# Patient Record
Sex: Female | Born: 1950 | Race: White | Hispanic: No | Smoking: Former smoker
Health system: Southern US, Community
[De-identification: ages and names within clinical notes are randomized; demographics above are authoritative.]

## PROBLEM LIST (undated history)

## (undated) DIAGNOSIS — D0471 Carcinoma in situ of skin of right lower limb, including hip: Secondary | ICD-10-CM

## (undated) DIAGNOSIS — Q211 Atrial septal defect: Secondary | ICD-10-CM

## (undated) DIAGNOSIS — D0472 Carcinoma in situ of skin of left lower limb, including hip: Secondary | ICD-10-CM

## (undated) DIAGNOSIS — D045 Carcinoma in situ of skin of trunk: Secondary | ICD-10-CM

## (undated) DIAGNOSIS — M1990 Unspecified osteoarthritis, unspecified site: Secondary | ICD-10-CM

## (undated) DIAGNOSIS — T7840XA Allergy, unspecified, initial encounter: Secondary | ICD-10-CM

## (undated) DIAGNOSIS — F419 Anxiety disorder, unspecified: Secondary | ICD-10-CM

## (undated) DIAGNOSIS — Q2112 Patent foramen ovale: Secondary | ICD-10-CM

## (undated) DIAGNOSIS — K219 Gastro-esophageal reflux disease without esophagitis: Secondary | ICD-10-CM

## (undated) HISTORY — DX: Anxiety disorder, unspecified: F41.9

## (undated) HISTORY — DX: Carcinoma in situ of skin of trunk: D04.5

## (undated) HISTORY — DX: Allergy, unspecified, initial encounter: T78.40XA

## (undated) HISTORY — DX: Carcinoma in situ of skin of right lower limb, including hip: D04.71

## (undated) HISTORY — PX: ABDOMINAL HYSTERECTOMY: SHX81

## (undated) HISTORY — PX: JOINT REPLACEMENT: SHX530

## (undated) HISTORY — PX: COSMETIC SURGERY: SHX468

## (undated) HISTORY — PX: EYE SURGERY: SHX253

## (undated) HISTORY — PX: BACK SURGERY: SHX140

## (undated) HISTORY — PX: SPINE SURGERY: SHX786

## (undated) HISTORY — DX: Carcinoma in situ of skin of left lower limb, including hip: D04.72

## (undated) HISTORY — PX: CERVICAL SPINE SURGERY: SHX589

## (undated) HISTORY — PX: VAGINAL HYSTERECTOMY: SUR661

## (undated) HISTORY — PX: BREAST SURGERY: SHX581

---

## 1997-04-20 ENCOUNTER — Other Ambulatory Visit: Admission: RE | Admit: 1997-04-20 | Discharge: 1997-04-20 | Payer: Self-pay | Admitting: Gynecology

## 1998-12-21 ENCOUNTER — Other Ambulatory Visit: Admission: RE | Admit: 1998-12-21 | Discharge: 1998-12-21 | Payer: Self-pay | Admitting: Gynecology

## 2000-10-26 ENCOUNTER — Encounter: Payer: Self-pay | Admitting: Neurosurgery

## 2000-10-26 ENCOUNTER — Ambulatory Visit (HOSPITAL_COMMUNITY): Admission: RE | Admit: 2000-10-26 | Discharge: 2000-10-26 | Payer: Self-pay | Admitting: Neurosurgery

## 2003-05-22 ENCOUNTER — Other Ambulatory Visit: Admission: RE | Admit: 2003-05-22 | Discharge: 2003-05-22 | Payer: Self-pay | Admitting: Family Medicine

## 2006-11-29 ENCOUNTER — Ambulatory Visit (HOSPITAL_COMMUNITY): Admission: RE | Admit: 2006-11-29 | Discharge: 2006-11-29 | Payer: Self-pay | Admitting: Neurosurgery

## 2008-04-02 ENCOUNTER — Encounter: Admission: RE | Admit: 2008-04-02 | Discharge: 2008-04-02 | Payer: Self-pay | Admitting: Neurosurgery

## 2008-09-16 ENCOUNTER — Ambulatory Visit (HOSPITAL_COMMUNITY): Admission: RE | Admit: 2008-09-16 | Discharge: 2008-09-16 | Payer: Self-pay | Admitting: Neurosurgery

## 2008-10-12 ENCOUNTER — Encounter: Admission: RE | Admit: 2008-10-12 | Discharge: 2008-10-12 | Payer: Self-pay | Admitting: Family Medicine

## 2008-10-16 ENCOUNTER — Encounter: Admission: RE | Admit: 2008-10-16 | Discharge: 2008-10-16 | Payer: Self-pay | Admitting: Family Medicine

## 2009-07-27 ENCOUNTER — Encounter: Admission: RE | Admit: 2009-07-27 | Discharge: 2009-07-27 | Payer: Self-pay | Admitting: Family Medicine

## 2009-10-22 ENCOUNTER — Emergency Department (HOSPITAL_COMMUNITY): Admission: EM | Admit: 2009-10-22 | Discharge: 2009-10-22 | Payer: Self-pay | Admitting: Emergency Medicine

## 2009-10-22 ENCOUNTER — Encounter: Payer: Self-pay | Admitting: Internal Medicine

## 2009-11-16 ENCOUNTER — Ambulatory Visit: Payer: Self-pay | Admitting: Internal Medicine

## 2009-11-16 DIAGNOSIS — R0602 Shortness of breath: Secondary | ICD-10-CM | POA: Insufficient documentation

## 2009-11-16 DIAGNOSIS — R05 Cough: Secondary | ICD-10-CM | POA: Insufficient documentation

## 2009-11-16 DIAGNOSIS — R059 Cough, unspecified: Secondary | ICD-10-CM | POA: Insufficient documentation

## 2009-11-29 ENCOUNTER — Ambulatory Visit: Payer: Self-pay | Admitting: Internal Medicine

## 2009-12-27 ENCOUNTER — Ambulatory Visit: Payer: Self-pay | Admitting: Internal Medicine

## 2009-12-27 ENCOUNTER — Encounter: Payer: Self-pay | Admitting: Internal Medicine

## 2010-02-03 ENCOUNTER — Encounter
Admission: RE | Admit: 2010-02-03 | Discharge: 2010-02-03 | Payer: Self-pay | Source: Home / Self Care | Attending: Family Medicine | Admitting: Family Medicine

## 2010-02-15 NOTE — Assessment & Plan Note (Signed)
Summary: Pulmonary Initial eval for cough/ sob   Visit Type:  Initial Consult Copy to:  Self Primary Provider/Referring Provider:  Dr. Nedra Hai  CC:  Dyspnea.  History of Present Illness: 15 yowf quit smoking 2003 with tendency to bad coughing since around 1995 some better since quit smoking with no seasonal pattern and no need for chronic rx.  November 16, 2009  1st pulmonary office eval cc sev years doe with ex assoc with persistent cough productive of minimal  white mucus rx with saba since Oct 22 2009 > much better.  Used spiriva x one box no benefit.  sob with > adl's even when not coughing.  cough day > night and no early am exac pattern. assoc with gen (not typically pleuritic) CP and hb with mild dysphagia.  Pt denies any significant sore throat,  itching, sneezing,  nasal congestion or excess secretions,  fever, chills, sweats, unintended wt loss, pleuritic or exertional cp, hempoptysis, orthopnea pnd or leg swelling Pt also denies any obvious fluctuation in symptoms with weather or environmental change or other alleviating or aggravating factors.         Current Medications (verified): 1)  Cyclobenzaprine Hcl 10 Mg Tabs (Cyclobenzaprine Hcl) .Marland Kitchen.. 1 Three Times A Day As Needed 2)  Nabumetone 500 Mg Tabs (Nabumetone) .... 2 Two Times A Day 3)  Citalopram Hydrobromide 20 Mg Tabs (Citalopram Hydrobromide) .Marland Kitchen.. 1 Once Daily 4)  Tramadol Hcl 50 Mg Tabs (Tramadol Hcl) .Marland Kitchen.. 1 To 2 Every 6 Hrs As Needed For Pain 5)  Alprazolam 0.5 Mg Tabs (Alprazolam) .... 1/2 To 1 Two Times A Day As Needed 6)  Aspirin 81 Mg Tbec (Aspirin) .Marland Kitchen.. 1 Once Daily 7)  Vitamin D 1000 Unit Tabs (Cholecalciferol) .Marland Kitchen.. 1 Once Daily 8)  Albuterol Sulfate (5 Mg/ml) 0.5% Nebu (Albuterol Sulfate) .Marland Kitchen.. 1 Vial in Nebulizer Up To Four Times A Day As Needed  Allergies (verified): 1)  Codeine  Past History:  Past Medical History: Cough/ sob x 2009  Past Surgical History: Back surgery 1998 Neck surgery  1992 Hysterectomy 1987 Left knee surgery x 2 Bilateral carpul tunnel release  Family History: Negative for respiratory diseases or atopy   Social History: Divorced Insurance account manager work  Former smoker.  Quit in 2003.  Smoked approx 10 yrs up to 1/4 ppd No ETOH  Review of Systems       The patient complains of shortness of breath with activity, shortness of breath at rest, productive cough, chest pain, acid heartburn, indigestion, difficulty swallowing, headaches, and joint stiffness or pain.  The patient denies non-productive cough, coughing up blood, irregular heartbeats, loss of appetite, weight change, abdominal pain, sore throat, tooth/dental problems, nasal congestion/difficulty breathing through nose, sneezing, itching, ear ache, anxiety, depression, hand/feet swelling, rash, change in color of mucus, and fever.    Vital Signs:  Patient profile:   60 year old female Height:      63 inches Weight:      183.13 pounds BMI:     32.56 O2 Sat:      96 % on Room air Temp:     97.8 degrees F oral Pulse rate:   70 / minute BP sitting:   132 / 88  (left arm)  Vitals Entered By: Vernie Murders (November 16, 2009 10:57 AM)  O2 Flow:  Room air  Physical Exam  Additional Exam:  wt 183 November 17, 2009 HEENT: nl dentition, turbinates, and orophanx. Nl external ear canals without cough  reflex NECK :  without JVD/Nodes/TM/ nl carotid upstrokes bilaterally LUNGS: no acc muscle use, clear to A and P bilaterally without cough on insp or exp maneuvers CV:  RRR  no s3 or murmur or increase in P2, no edema  ABD:  soft and nontender with nl excursion in the supine position. No bruits or organomegaly, bowel sounds nl MS:  warm without deformities, calf tenderness, cyanosis or clubbing SKIN: warm and dry without lesions   NEURO:  alert, approp, no deficits     CT of Chest  Procedure date:  10/22/2009  Findings:      Neg CT angiogram  Impression & Recommendations:  Problem # 1:   COUGH (ICD-786.2)  The most common causes of chronic cough in immunocompetent adults include: upper airway cough syndrome (UACS), previously referred to as postnasal drip syndrome,  caused by variety of rhinosinus conditions; (2) asthma; (3) GERD; (4) chronic bronchitis from cigarette smoking or other inhaled environmental irritants; (5) nonasthmatic eosinophilic bronchitis; and (6) bronchiectasis. These conditions, singly or in combination, have accounted for up to 94% of the causes of chronic cough in prospective studies.   this is most c/w  Classic Upper airway cough syndrome, so named because it's frequently impossible to sort out how much is  CR/sinusitis with freq throat clearing (which can be related to primary GERD)   vs  causing  secondary extra esophageal GERD from wide swings in gastric pressure that occur with throat clearing, promoting self use of mint and menthol lozenges that reduce the lower esophageal sphincter tone and exacerbate the problem further These are the same pts who not infrequently have failed to tolerate ace inhibitors,  dry powder inhalers or biphosphonates or report having reflux symptoms that don't respond to standard doses of PPI  See instructions for specific recommendations   Orders: Consultation Level V (16109)  Problem # 2:  DYSPNEA (ICD-786.05)  May have asthma based on reported improvement on saba  I spent extra time with the patient today explaining optimal mdi  technique.  This improved from  50-75%  See instructions for specific recommendations   Orders: Consultation Level V [phone])  Medications Added to Medication List This Visit: 1)  Cyclobenzaprine Hcl 10 Mg Tabs (Cyclobenzaprine hcl) .Marland Kitchen.. 1 three times a day as needed 2)  Nabumetone 500 Mg Tabs (Nabumetone) .... 2 two times a day 3)  Citalopram Hydrobromide 20 Mg Tabs (Citalopram hydrobromide) .Marland Kitchen.. 1 once daily 4)  Tramadol Hcl 50 Mg Tabs (Tramadol hcl) .Marland Kitchen.. 1 to 2 every 6 hrs as needed for  pain 5)  Tramadol Hcl 50 Mg Tabs (Tramadol hcl) .Marland Kitchen.. 1 to 2 every 6 hrs as needed for pain or cough 6)  Alprazolam 0.5 Mg Tabs (Alprazolam) .... 1/2 to 1 two times a day as needed 7)  Aspirin 81 Mg Tbec (Aspirin) .Marland Kitchen.. 1 once daily 8)  Vitamin D 1000 Unit Tabs (Cholecalciferol) .Marland Kitchen.. 1 once daily 9)  Albuterol Sulfate (5 Mg/ml) 0.5% Nebu (Albuterol sulfate) .Marland Kitchen.. 1 vial in nebulizer up to four times a day as needed 10)  Ventolin Hfa 108 (90 Base) Mcg/act Aers (Albuterol sulfate) .Marland Kitchen.. 1-2 puffs every 4-6 hours as needed 11)  Dulera 100-5 Mcg/act Aero (Mometasone furo-formoterol fum) .... 2 puffs first thing  in am and 2 puffs again in pm about 12 hours later 12)  Prilosec Otc 20 Mg Tbec (Omeprazole magnesium) .... Take  one 30-60 min before first meal of the day 13)  Pepcid 20 Mg Tabs (Famotidine) .Marland KitchenMarland KitchenMarland Kitchen  Take one by mouth at bedtime  Patient Instructions: 1)  Take delsym two tsp every 12 hours and add tramadol 50 mg up to every 4 hours to suppress the urge to cough. Swallowing water or using ice chips/non mint and menthol containing candies (such as lifesavers or sugarless jolly ranchers) are also effective.  2)  Dulera 100 2 puffs first thing  in am and 2 puffs again in pm about 12 hours later  3)  Prilosec Take  one 30-60 min before first meal of the day and pepcid at bedtime 4)  Please schedule a follow-up appointment in 2 weeks, sooner if needed  5)  GERD (REFLUX)  is a common cause of respiratory symptoms. It commonly presents without heartburn and can be treated with medication, but also with lifestyle changes including avoidance of late meals, excessive alcohol, smoking cessation, and avoid fatty foods, chocolate, peppermint, colas, red wine, and acidic juices such as orange juice. NO MINT OR MENTHOL PRODUCTS SO NO COUGH DROPS  6)  USE SUGARLESS CANDY INSTEAD (jolley ranchers)  7)  NO OIL BASED VITAMINS  Prescriptions: TRAMADOL HCL 50 MG TABS (TRAMADOL HCL) 1 to 2 every 6 hrs as needed for pain  or cough  #40 x 0   Entered and Authorized by:   Nyoka Cowden MD   Signed by:   Nyoka Cowden MD on 11/16/2009   Method used:   Electronically to        [facility]  [address] [phone]* (retail)       [address]       [place], [place]  [address]       Ph: [phone]       Fax: [phone]   RxID:   [id number]

## 2010-02-15 NOTE — Assessment & Plan Note (Signed)
Summary: Pulmonary/ ext ov with hfa 90% and better on dulera 100   Copy to:  Self Primary Provider/Referring Provider:  Dr. Nedra Hai  CC:  Dyspnea and Cough- much improved.  History of Present Illness: 60 yowf quit smoking 2003 with tendency to bad coughing since around 1995 some better since quit smoking with no seasonal pattern and no need for chronic rx.  November 16, 2009  1st pulmonary office eval cc sev years doe with ex assoc with persistent cough productive of minimal  white mucus rx with saba since Oct 22 2009 > much better.  Used spiriva x one box no benefit.  sob with > adl's even when not coughing.  cough day > night and no early am exac pattern. assoc with gen (not typically pleuritic) CP and hb with mild dysphagia.   rec Take delsym two tsp every 12 hours and add tramadol 50 mg up to every 4 hours to suppress the urge to cough. Swallowing water or using ice chips/non mint and menthol containing candies (such as lifesavers or sugarless jolly ranchers) are also effective.  Dulera 100 2 puffs first thing  in am and 2 puffs again in pm about 12 hours later  Prilosec Take  one 30-60 min before first meal of the day and pepcid at bedtime  November 29, 2009  cc Dyspnea and Cough- much improved. Pt denies any significant sore throat, dysphagia, itching, sneezing,  nasal congestion or excess secretions,  fever, chills, sweats, unintended wt loss, pleuritic or exertional cp, hempoptysis, change in activity tolerance  orthopnea pnd or leg swelling Pt also denies any obvious fluctuation in symptoms with weather or environmental change or other alleviating or aggravating factors.        Current Medications (verified): 1)  Cyclobenzaprine Hcl 10 Mg Tabs (Cyclobenzaprine Hcl) .Marland Kitchen.. 1 Three Times A Day As Needed 2)  Nabumetone 500 Mg Tabs (Nabumetone) .... 2 Two Times A Day 3)  Citalopram Hydrobromide 20 Mg Tabs (Citalopram Hydrobromide) .Marland Kitchen.. 1 Once Daily 4)  Tramadol Hcl 50 Mg Tabs (Tramadol  Hcl) .Marland Kitchen.. 1 To 2 Every 6 Hrs As Needed For Pain or Cough 5)  Alprazolam 0.5 Mg Tabs (Alprazolam) .... 1/2 To 1 Two Times A Day As Needed 6)  Aspirin 81 Mg Tbec (Aspirin) .Marland Kitchen.. 1 Once Daily 7)  Vitamin D 1000 Unit Tabs (Cholecalciferol) .Marland Kitchen.. 1 Once Daily 8)  Ventolin Hfa 108 (90 Base) Mcg/act  Aers (Albuterol Sulfate) .Marland Kitchen.. 1-2 Puffs Every 4-6 Hours As Needed 9)  Dulera 100-5 Mcg/act Aero (Mometasone Furo-Formoterol Fum) .... 2 Puffs First Thing  in Am and 2 Puffs Again in Pm About 12 Hours Later 10)  Prilosec Otc 20 Mg Tbec (Omeprazole Magnesium) .... Take  One 30-60 Min Before First Meal of The Day 11)  Pepcid 20 Mg Tabs (Famotidine) .... Take One By Mouth At Bedtime  Allergies (verified): 1)  Codeine  Past History:  Past Medical History: Cough/ sob x 2009      -  HFA 90% p coaching November 29, 2009    Vital Signs:  Patient profile:   60 year old female Weight:      181 pounds O2 Sat:      95 % on Room air Temp:     98.0 degrees F oral Pulse rate:   74 / minute BP sitting:   118 / 76  (left arm)  Vitals Entered By: Vernie Murders (November 29, 2009 11:09 AM)  O2 Flow:  Room air  Physical Exam  Additional Exam:  wt 183 November 17, 2009 > 181 November 29, 2009  HEENT: nl dentition, turbinates, and orophanx. Nl external ear canals without cough reflex NECK :  without JVD/Nodes/TM/ nl carotid upstrokes bilaterally LUNGS: no acc muscle use, clear to A and P bilaterally without cough on insp or exp maneuvers CV:  RRR  no s3 or murmur or increase in P2, no edema  ABD:  soft and nontender with nl excursion in the supine position. No bruits or organomegaly, bowel sounds nl MS:  warm without deformities, calf tenderness, cyanosis or clubbing       Impression & Recommendations:  Problem # 1:  COUGH (ICD-786.2)  The most common causes of chronic cough in immunocompetent adults include: upper airway cough syndrome (UACS), previously referred to as postnasal drip syndrome,  caused  by variety of rhinosinus conditions; (2) asthma; (3) GERD; (4) chronic bronchitis from cigarette smoking or other inhaled environmental irritants; (5) nonasthmatic eosinophilic bronchitis; and (6) bronchiectasis. These conditions, singly or in combination, have accounted for up to 94% of the causes of chronic cough in prospective studies.  Much better with rx with dulera and ppi/h2hs so issue is whether this is cough assoc with gerd or gerd causing cough.  will try taper gerd rx then see if symptoms or need for saba increase. See instructions for specific recommendations   .  This improved from  50-90% with coaching  Each maintenance medication was reviewed in detail including most importantly the difference between maintenance prns and under what circumstances the prns are to be used.  In addition, these two groups (for which the patient should keep up with refills) were distinguished from a third group :  meds that are used only short term with the intent to complete a course of therapy and then not refill them.  The med list was then fully reconciled and reorganized to reflect this important distinction.   Other Orders: Est. Patient Level IV (04540)  Patient Instructions: 1)   Think of your medications in 3 separate categories and keep them separate:  2)  a   The ones you take no matter what daily on a scheduled basis (dulera) 3)  b   The ones you only take if needed for specific problems(ventolin)  4)  c   The ones you take for a short course and stop, like antibiotics and prednisone. 5)   Stop pepcid for now, restart if you  6)  Please schedule a follow-up appointment in 4  weeks, sooner if needed with pft's on return

## 2010-02-17 NOTE — Miscellaneous (Signed)
Summary: Orders Update pft charges  Clinical Lists Changes  Orders: Added new Service order of Carbon Monoxide diffusing w/capacity (94720) - Signed Added new Service order of Lung Volumes (94240) - Signed Added new Service order of Spirometry (Pre & Post) (94060) - Signed 

## 2010-02-17 NOTE — Assessment & Plan Note (Signed)
Summary: Pulmonary/ f/u ov PFT's nl    Copy to:  Self Primary Provider/Referring Provider:  Dr. Nedra Hai  CC:  Cough and dyspnea- the same.  History of Present Illness: 60  yowf quit smoking 2003 with tendency to bad coughing since around 1995 some better since quit smoking with no seasonal pattern and no need for chronic rx.  November 16, 2009  1st pulmonary office eval cc sev years doe with ex assoc with persistent cough productive of minimal  white mucus rx with saba since Oct 22 2009 > much better.  Used spiriva x one box no benefit.  sob with > adl's even when not coughing.  cough day > night and no early am exac pattern. assoc with gen (not typically pleuritic) CP and hb with mild dysphagia.   rec Take delsym two tsp every 12 hours and add tramadol 50 mg up to every 4 hours to suppress the urge to cough. Swallowing water or using ice chips/non mint and menthol containing candies (such as lifesavers or sugarless jolly ranchers) are also effective.  Dulera 100 2 puffs first thing  in am and 2 puffs again in pm about 12 hours later  Prilosec Take  one 30-60 min before first meal of the day and pepcid at bedtime  November 29, 2009  cc Dyspnea and Cough- much improved. Pt denies any significant sore throat, dysphagia, itching, sneezing,  nasal congestion or excess secretions,  fever, chills, sweats, unintended wt loss, pleuritic or exertional cp, hempoptysis, change in activity tolerance  orthopnea pnd or leg swelling Pt also denies any obvious fluctuation in symptoms with weather or environmental change or other alleviating or aggravating factors.       Current Medications (verified): 1)  Prilosec Otc 20 Mg Tbec (Omeprazole Magnesium) .... Take  One 30-60 Min Before First Meal of The Day 2)  Dulera 100-5 Mcg/act Aero (Mometasone Furo-Formoterol Fum) .... 2 Puffs First Thing  in Am and 2 Puffs Again in Pm About 12 Hours Later 3)  Aspirin 81 Mg Tbec (Aspirin) .Marland Kitchen.. 1 Once Daily 4)  Nabumetone  500 Mg Tabs (Nabumetone) .... 2 Two Times A Day 5)  Vitamin D 1000 Unit Tabs (Cholecalciferol) .Marland Kitchen.. 1 Once Daily 6)  Citalopram Hydrobromide 20 Mg Tabs (Citalopram Hydrobromide) .Marland Kitchen.. 1 Once Daily 7)  Tramadol Hcl 50 Mg Tabs (Tramadol Hcl) .Marland Kitchen.. 1 To 2 Every 6 Hrs As Needed For Pain or Cough 8)  Alprazolam 0.5 Mg Tabs (Alprazolam) .... 1/2 To 1 Two Times A Day As Needed 9)  Cyclobenzaprine Hcl 10 Mg Tabs (Cyclobenzaprine Hcl) .Marland Kitchen.. 1 Three Times A Day As Needed 10)  Ventolin Hfa 108 (90 Base) Mcg/act  Aers (Albuterol Sulfate) .Marland Kitchen.. 1-2 Puffs Every 4-6 Hours As Needed  Allergies (verified): 1)  Codeine  Past History:  Past Medical History: Cough/ sob x 2009      - CT chest neg 10/22/09      -  HFA 90% p coaching November 29, 2009        - PFT's 12/27/09 FEV1 2.10 (90%) ratio 80 and DLC0 66%  Vital Signs:  Patient profile:   60 year old female Height:      64 inches Weight:      184 pounds BMI:     31.70 O2 Sat:      98 % on Room air Temp:     97.6 degrees F oral Pulse rate:   70 / minute BP sitting:   106 / 60  (  left arm)  Vitals Entered By: Vernie Murders (December 27, 2009 12:01 PM)  O2 Flow:  Room air  Physical Exam  Additional Exam:  wt 183 November 17, 2009 > 181 November 29, 2009 > 184 December 28, 2009  HEENT: nl dentition, turbinates, and orophanx. Nl external ear canals without cough reflex NECK :  without JVD/Nodes/TM/ nl carotid upstrokes bilaterally LUNGS: no acc muscle use, clear to A and P bilaterally without cough on insp or exp maneuvers CV:  RRR  no s3 or murmur or increase in P2, no edema  ABD:  soft and nontender with nl excursion in the supine position. No bruits or organomegaly, bowel sounds nl MS:  warm without deformities, calf tenderness, cyanosis or clubbing       Impression & Recommendations:  Problem # 1:  DYSPNEA (ICD-786.05)  No evidence of airflow obstruction so this is not copd but may be asthma.  try taper dulera and see if flares using the  reverse of a therapeutic trial   Problem # 2:  COUGH (ICD-786.2)  The most common causes of chronic cough in immunocompetent adults include: upper airway cough syndrome (UACS), previously referred to as postnasal drip syndrome,  caused by variety of rhinosinus conditions; (2) asthma; (3) GERD; (4) chronic bronchitis from cigarette smoking or other inhaled environmental irritants; (5) nonasthmatic eosinophilic bronchitis; and (6) bronchiectasis. These conditions, singly or in combination, have accounted for up to 94% of the causes of chronic cough in prospective studies.  Much better with rx with dulera and ppi/h2hs so issue is whether this is cough assoc with gerd or gerd causing cough. since has no more cough or significant sob will try taper dulera before stopping ppi to determine longterm rx  Orders: Est. Patient Level IV (81191)  Patient Instructions: 1)  Try off dulera and see breathing stays the same or you need the ventolin more and if so you need to return 2)  You lung function is excellent, you do have  not signifcant copd.  3)

## 2010-03-31 LAB — CBC
HCT: 38.9 % (ref 36.0–46.0)
Hemoglobin: 13 g/dL (ref 12.0–15.0)
MCH: 28.3 pg (ref 26.0–34.0)
MCHC: 33.4 g/dL (ref 30.0–36.0)
MCV: 84.6 fL (ref 78.0–100.0)
Platelets: 234 10*3/uL (ref 150–400)
RBC: 4.6 MIL/uL (ref 3.87–5.11)
RDW: 12.1 % (ref 11.5–15.5)
WBC: 7.7 10*3/uL (ref 4.0–10.5)

## 2010-03-31 LAB — BASIC METABOLIC PANEL
BUN: 15 mg/dL (ref 6–23)
CO2: 21 mEq/L (ref 19–32)
Calcium: 9.3 mg/dL (ref 8.4–10.5)
Chloride: 107 mEq/L (ref 96–112)
Creatinine, Ser: 0.76 mg/dL (ref 0.4–1.2)
GFR calc Af Amer: >60 mL/min (ref >60)
GFR calc non Af Amer: >60 mL/min (ref >60)
Glucose, Bld: 97 mg/dL (ref 70–99)
Potassium: 4 mEq/L (ref 3.5–5.1)
Sodium: 138 mEq/L (ref 135–145)

## 2010-03-31 LAB — URINALYSIS, ROUTINE W REFLEX MICROSCOPIC
Bilirubin Urine: NEGATIVE
Glucose, UA: NEGATIVE mg/dL
Hgb urine dipstick: NEGATIVE
Ketones, ur: NEGATIVE mg/dL
Nitrite: NEGATIVE
Protein, ur: 30 mg/dL — AB
Specific Gravity, Urine: 1.026 (ref 1.005–1.030)
Urobilinogen, UA: 1 mg/dL (ref 0.0–1.0)
pH: 8.5 — ABNORMAL HIGH (ref 5.0–8.0)

## 2010-03-31 LAB — DIFFERENTIAL
Basophils Absolute: 0.1 10*3/uL (ref 0.0–0.1)
Basophils Relative: 1 % (ref 0–1)
Eosinophils Absolute: 0.2 10*3/uL (ref 0.0–0.7)
Eosinophils Relative: 3 % (ref 0–5)
Lymphocytes Relative: 28 % (ref 12–46)
Lymphs Abs: 2.2 10*3/uL (ref 0.7–4.0)
Monocytes Absolute: 0.5 10*3/uL (ref 0.1–1.0)
Monocytes Relative: 7 % (ref 3–12)
Neutro Abs: 4.7 10*3/uL (ref 1.7–7.7)
Neutrophils Relative %: 61 % (ref 43–77)

## 2010-03-31 LAB — URINE MICROSCOPIC-ADD ON

## 2010-03-31 LAB — POCT CARDIAC MARKERS
CKMB, poc: <1 ng/mL — ABNORMAL LOW (ref 1.0–8.0)
Myoglobin, poc: 32.2 ng/mL (ref 12–200)
Troponin i, poc: <0.05 ng/mL (ref 0.00–0.09)

## 2010-03-31 LAB — D-DIMER, QUANTITATIVE: D-Dimer, Quant: 0.27 ug/mL-FEU (ref 0.00–0.48)

## 2010-05-31 NOTE — Op Note (Signed)
[name], [name]              ACCOUNT NO.:  [id number]   MEDICAL RECORD NO.:  [id number]          PATIENT TYPE:  AMB   LOCATION:  SDS                          FACILITY:  [facility]   PHYSICIAN:  Hewitt Shorts, M.D.DATE OF BIRTH:  [birth year]   DATE OF PROCEDURE:  11/29/2006  DATE OF DISCHARGE:                               OPERATIVE REPORT   PREOPERATIVE DIAGNOSIS:  Carpal tunnel syndrome.   POSTOPERATIVE DIAGNOSIS:  Carpal tunnel syndrome.   PROCEDURE:  Right carpal tunnel release.   ANESTHESIA:  Bier block with intravenous sedation.   INDICATIONS:  The patient is a 60 year old woman with carpal tunnel  symptoms.  EMG nerve conduction confirmed carpal tunnel syndrome.  The  decision was made to proceed with the right carpal tunnel release.   PROCEDURE IN DETAIL:  The patient was brought to the operating room.  A  Bier block was administered to the right upper extremity.  The right  upper extremity was prepped with Betadine solution and draped in a  sterile fashion.  We then made an incision just medial to the right  thenar crease beginning from the distal carpal crease and extending  distally into the palm.  Dissection was carried down through the  subcutaneous tissue and went down to the transverse carpal ligament that  was carefully divided from its distal to proximal extent, carefully  avoiding any compression of the underlying median nerve.  The ligament  was fully opened from distal to proximal.  The area of compression was  noted near the midpoint of the transverse carpal ligament.  Once the  ligament was fully opened, we then proceeded with closure.  The  subcuticular layer was closed with interrupted inverted 2-0 undyed  Vicryl sutures.  The skin edges were approximated with interrupted 3-0  nylon in a horizontal mattress fashion.  Adaptic and sterile gauze was  applied, and the hand was rewrapped with a Kling.  The tourniquet was  then released.  The procedure was  tolerated well.  The estimated blood  loss was nil.  Sponge and needle counts were correct.  Following the  procedure, the patient was transferred to the recovery room with the  right upper extremity elevated with a sling.  The patient is being  discharged home.  She has Tylenol, Advil, and hydrocodone at home to use  as needed for discomfort, and she is to follow up tomorrow for a  dressing change in the office.      Hewitt Shorts, M.D.  Electronically Signed     RWN/MEDQ  D:  11/29/2006  T:  11/29/2006  Job:  [id number]

## 2010-09-02 ENCOUNTER — Other Ambulatory Visit (HOSPITAL_COMMUNITY): Payer: Self-pay | Admitting: Otolaryngology

## 2010-09-07 ENCOUNTER — Ambulatory Visit (HOSPITAL_COMMUNITY)
Admission: RE | Admit: 2010-09-07 | Discharge: 2010-09-07 | Disposition: A | Discharge status: Home / Self Care | Payer: 59 | Source: Ambulatory Visit | Attending: Otolaryngology | Admitting: Otolaryngology

## 2010-09-07 ENCOUNTER — Other Ambulatory Visit (HOSPITAL_COMMUNITY): Payer: Self-pay | Admitting: Occupational Therapy

## 2010-09-07 ENCOUNTER — Other Ambulatory Visit (HOSPITAL_COMMUNITY): Payer: Self-pay | Admitting: Radiation Oncology

## 2010-09-07 DIAGNOSIS — R1310 Dysphagia, unspecified: Secondary | ICD-10-CM | POA: Insufficient documentation

## 2010-09-29 ENCOUNTER — Other Ambulatory Visit: Payer: Self-pay | Admitting: Neurology

## 2010-09-29 DIAGNOSIS — G8929 Other chronic pain: Secondary | ICD-10-CM

## 2010-09-29 DIAGNOSIS — M545 Low back pain, unspecified: Secondary | ICD-10-CM

## 2010-10-21 ENCOUNTER — Ambulatory Visit: Payer: 59 | Attending: Otolaryngology

## 2010-10-21 ENCOUNTER — Other Ambulatory Visit: Payer: 59

## 2010-10-21 DIAGNOSIS — R490 Dysphonia: Secondary | ICD-10-CM | POA: Insufficient documentation

## 2010-10-21 DIAGNOSIS — R1313 Dysphagia, pharyngeal phase: Secondary | ICD-10-CM | POA: Insufficient documentation

## 2010-10-21 DIAGNOSIS — IMO0001 Reserved for inherently not codable concepts without codable children: Secondary | ICD-10-CM | POA: Insufficient documentation

## 2010-10-25 LAB — CBC
HCT: 38.7
Hemoglobin: 13.4
MCHC: 34.6
MCV: 84.9
Platelets: 273
RBC: 4.55
RDW: 12.5
WBC: 6.9

## 2010-10-28 ENCOUNTER — Ambulatory Visit: Payer: 59

## 2012-02-20 ENCOUNTER — Other Ambulatory Visit: Payer: Self-pay | Admitting: Family Medicine

## 2012-02-20 DIAGNOSIS — Z1231 Encounter for screening mammogram for malignant neoplasm of breast: Secondary | ICD-10-CM

## 2012-10-03 ENCOUNTER — Other Ambulatory Visit: Payer: Self-pay | Admitting: Family Medicine

## 2012-10-03 ENCOUNTER — Ambulatory Visit
Admission: RE | Admit: 2012-10-03 | Discharge: 2012-10-03 | Disposition: A | Discharge status: Home / Self Care | Payer: BC Managed Care – PPO | Source: Ambulatory Visit | Attending: Family Medicine | Admitting: Family Medicine

## 2012-10-03 DIAGNOSIS — M545 Low back pain, unspecified: Secondary | ICD-10-CM

## 2012-10-03 DIAGNOSIS — M5410 Radiculopathy, site unspecified: Secondary | ICD-10-CM

## 2013-10-02 ENCOUNTER — Other Ambulatory Visit: Payer: Self-pay

## 2013-10-02 DIAGNOSIS — Z1231 Encounter for screening mammogram for malignant neoplasm of breast: Secondary | ICD-10-CM

## 2013-10-28 ENCOUNTER — Ambulatory Visit
Admission: RE | Admit: 2013-10-28 | Discharge: 2013-10-28 | Disposition: A | Discharge status: Home / Self Care | Payer: BC Managed Care – PPO | Source: Ambulatory Visit

## 2013-10-28 DIAGNOSIS — Z1231 Encounter for screening mammogram for malignant neoplasm of breast: Secondary | ICD-10-CM

## 2014-09-22 ENCOUNTER — Other Ambulatory Visit: Payer: Self-pay

## 2014-09-22 DIAGNOSIS — Z1231 Encounter for screening mammogram for malignant neoplasm of breast: Secondary | ICD-10-CM

## 2014-11-06 ENCOUNTER — Ambulatory Visit: Payer: Self-pay

## 2014-11-13 ENCOUNTER — Ambulatory Visit: Payer: Self-pay

## 2014-12-04 ENCOUNTER — Ambulatory Visit
Admission: RE | Admit: 2014-12-04 | Discharge: 2014-12-04 | Disposition: A | Discharge status: Home / Self Care | Payer: BLUE CROSS/BLUE SHIELD | Source: Ambulatory Visit

## 2014-12-04 DIAGNOSIS — Z1231 Encounter for screening mammogram for malignant neoplasm of breast: Secondary | ICD-10-CM

## 2015-01-04 NOTE — H&P (Signed)
  Anna Hodges is an 64 y.o. female.    Chief Complaint: left shoulder pain  HPI: Pt is a 64 y.o. female complaining of left shoulder pain for multiple years. Pain had continually increased since the beginning. X-rays in the clinic show end-stage arthritic changes of the left shoulder. Pt has tried various conservative treatments which have failed to alleviate their symptoms, including injections and therapy. Various options are discussed with the patient. Risks, benefits and expectations were discussed with the patient. Patient understand the risks, benefits and expectations and wishes to proceed with surgery.   PCP:  Biagio Borg, MD  D/C Plans: Home  PMH: No past medical history on file.  PSH: No past surgical history on file.  Social History:  has no tobacco, alcohol, and drug history on file.  Allergies:  Allergies  Allergen Reactions  . Codeine     REACTION: GI upset    Medications: No current facility-administered medications for this encounter.   No current outpatient prescriptions on file.    No results found for this or any previous visit (from the past 48 hour(s)). No results found.  ROS: Pain with rom of the left upper extremity  Physical Exam:  Alert and oriented 64 y.o. female in no acute distress Cranial nerves 2-12 intact Cervical spine: full rom with no tenderness, nv intact distally Chest: active breath sounds bilaterally, no wheeze rhonchi or rales Heart: regular rate and rhythm, no murmur Abd: non tender non distended with active bowel sounds Hip is stable with rom  Left shoulder painful rom nv intact distally Strength of ER and IR 4/5 No rashes or edema  Assessment/Plan Assessment: left shoulder end stage osteoarthritis  Plan: Patient will undergo a left total shoulder arthroplasty by Dr. Veverly Fells at [facility]. Risks benefits and expectations were discussed with the patient. Patient understand risks, benefits and expectations and  wishes to proceed.

## 2015-01-13 ENCOUNTER — Other Ambulatory Visit: Payer: Self-pay

## 2015-01-13 ENCOUNTER — Encounter (HOSPITAL_COMMUNITY)
Admission: RE | Admit: 2015-01-13 | Discharge: 2015-01-13 | Disposition: A | Discharge status: Home / Self Care | Payer: BLUE CROSS/BLUE SHIELD | Source: Ambulatory Visit | Attending: Orthopedic Surgery | Admitting: Orthopedic Surgery

## 2015-01-13 ENCOUNTER — Encounter (HOSPITAL_COMMUNITY): Payer: Self-pay

## 2015-01-13 DIAGNOSIS — K219 Gastro-esophageal reflux disease without esophagitis: Secondary | ICD-10-CM | POA: Insufficient documentation

## 2015-01-13 DIAGNOSIS — Q211 Atrial septal defect: Secondary | ICD-10-CM | POA: Insufficient documentation

## 2015-01-13 DIAGNOSIS — Z7982 Long term (current) use of aspirin: Secondary | ICD-10-CM | POA: Insufficient documentation

## 2015-01-13 DIAGNOSIS — M19012 Primary osteoarthritis, left shoulder: Secondary | ICD-10-CM | POA: Insufficient documentation

## 2015-01-13 DIAGNOSIS — Z79899 Other long term (current) drug therapy: Secondary | ICD-10-CM | POA: Diagnosis not present

## 2015-01-13 DIAGNOSIS — R9431 Abnormal electrocardiogram [ECG] [EKG]: Secondary | ICD-10-CM | POA: Diagnosis not present

## 2015-01-13 DIAGNOSIS — Z01812 Encounter for preprocedural laboratory examination: Secondary | ICD-10-CM | POA: Diagnosis not present

## 2015-01-13 DIAGNOSIS — Z01818 Encounter for other preprocedural examination: Secondary | ICD-10-CM | POA: Diagnosis not present

## 2015-01-13 HISTORY — DX: Atrial septal defect: Q21.1

## 2015-01-13 HISTORY — DX: Unspecified osteoarthritis, unspecified site: M19.90

## 2015-01-13 HISTORY — DX: Gastro-esophageal reflux disease without esophagitis: K21.9

## 2015-01-13 HISTORY — DX: Patent foramen ovale: Q21.12

## 2015-01-13 LAB — CBC
HCT: 41.8 % (ref 36.0–46.0)
Hemoglobin: 13.6 g/dL (ref 12.0–15.0)
MCH: 28.1 pg (ref 26.0–34.0)
MCHC: 32.5 g/dL (ref 30.0–36.0)
MCV: 86.4 fL (ref 78.0–100.0)
Platelets: 237 10*3/uL (ref 150–400)
RBC: 4.84 MIL/uL (ref 3.87–5.11)
RDW: 12.2 % (ref 11.5–15.5)
WBC: 8.2 10*3/uL (ref 4.0–10.5)

## 2015-01-13 LAB — SURGICAL PCR SCREEN
MRSA, PCR: NEGATIVE
Staphylococcus aureus: NEGATIVE

## 2015-01-13 LAB — BASIC METABOLIC PANEL
Anion gap: 9 (ref 5–15)
BUN: 7 mg/dL (ref 6–20)
CO2: 23 mmol/L (ref 22–32)
Calcium: 9.4 mg/dL (ref 8.9–10.3)
Chloride: 107 mmol/L (ref 101–111)
Creatinine, Ser: 0.56 mg/dL (ref 0.44–1.00)
GFR calc Af Amer: >60 mL/min (ref >60)
GFR calc non Af Amer: >60 mL/min (ref >60)
Glucose, Bld: 123 mg/dL — ABNORMAL HIGH (ref 65–99)
Potassium: 3.7 mmol/L (ref 3.5–5.1)
Sodium: 139 mmol/L (ref 135–145)

## 2015-01-13 NOTE — Pre-Procedure Instructions (Signed)
Anna Hodges  01/13/2015      [id number] [place], [place] - [address] [facility] [place] [address] [place] [address] Phone: [phone] Fax: [phone]    Your procedure is scheduled on January 6th, Friday   Report to [facility] Admitting at 8:30 AM             ( Tentative Surgery time is 10:30 AM - 1:30 PM.)   Call this number if you have problems the morning of surgery:  [phone]   Remember:  Do not eat food or drink liquids after midnight Thursday.  Take these medicines the morning of surgery with A SIP OF WATER : Celexa, Prilosec, Tramadol   Do not wear jewelry, make-up or nail polish.  Do not wear lotions, powders, or perfumes.  You may NOT wear deodorant the day of surgery.  Do not shave underarms & legs 48 hours prior to surgery.     Do not bring valuables to the hospital.  [facility] is not responsible for any belongings or valuables.  Contacts, dentures or bridgework may not be worn into surgery.  Leave your suitcase in the car.  After surgery it may be brought to your room. For patients admitted to the hospital, discharge time will be determined by your treatment team.    Name and phone number of your driver:      Please read over the following fact sheets that you were given. Pain Booklet, Coughing and Deep Breathing, MRSA Information and Surgical Site Infection Prevention

## 2015-01-13 NOTE — Progress Notes (Addendum)
Pt states [facility] Since she was having pain going down both legs, then she mentioned Dr. Jannifer Franklin and was informed that she had a "small hole in her heart, and not to worry, nothing to be concerned about"   Has not been to a cardio, denies any chest pain or heart issues.    PCP is Rbt Reade.   Called Dr. Penni Bombard Reade's office and they don't have one.

## 2015-01-15 ENCOUNTER — Encounter (HOSPITAL_COMMUNITY): Payer: Self-pay

## 2015-01-15 NOTE — Progress Notes (Signed)
Anesthesia Chart Review: Patient is a 64 year old female scheduled for left total shoulder arthroplasty on 01/22/15 by Dr. Veverly Fells.  History includes small PFO, GERD, arthritis, hysterectomy, C-spine surgery, L-spine surgery, breast implants. BMI is consistent with obesity.   PCP is Dr. Alyson Ingles who signed a note of medical clearance for this procedure.  She was evaluated by neurologists Dr. Floyde Parkins and Dr. Antony Contras in 2011. She had an MRI brain on 11/10/09 to evaluate for numbness in her extremities. Results showed: Impression: Equivocal MRI of the brain (without contrast) demonstrating a few nonspecific foci of gliosis. These are nonspecific in their appearance and can be seen with chronic microvascular ischemia, autoimmune, inflammatory or post infectious etiologies, and associated with chronic migraine headaches or as a normal variant. Based on results, a transcranial Doppler bubble study was recommended. 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 Dr. Leonie Man felt her small PFO was unlikely to be of clinical significance. ASA recommended.  Meds include aspirin 81 mg, Celexa, Relafen, Prilosec, tramadol.  01/13/15 EKG: NSR, possible LAE, LVH, non-specific ST/T wave abnormality. LVH new when compared to 10/22/09 tracing in [place].  Preoperative labs noted.  With known small PFO, will need to be careful to avoid any air in her IV lines. If no acute changes then I would anticipate that she could proceed as planned. Anesthesiologist Dr. Kalman Shan agrees with this plan.  George Hugh [facility]/Anesthesiology Phone [phone] 01/15/2015 3:19 PM

## 2015-01-21 MED ORDER — CHLORHEXIDINE GLUCONATE 4 % EX LIQD: 60.0000 mL | Freq: Once | CUTANEOUS | Status: DC

## 2015-01-21 MED ORDER — CEFAZOLIN SODIUM-DEXTROSE 2-3 GM-% IV SOLR
2.0000 g | INTRAVENOUS | Status: AC
  Administered 2015-01-22: 2 g via INTRAVENOUS
  Filled 2015-01-21 (×2): qty 50

## 2015-01-22 ENCOUNTER — Inpatient Hospital Stay (HOSPITAL_COMMUNITY): Payer: BLUE CROSS/BLUE SHIELD

## 2015-01-22 ENCOUNTER — Inpatient Hospital Stay (HOSPITAL_COMMUNITY): Payer: BLUE CROSS/BLUE SHIELD | Admitting: Anesthesiology

## 2015-01-22 ENCOUNTER — Encounter (HOSPITAL_COMMUNITY)
Admission: AD | Disposition: A | Discharge status: Home / Self Care | Payer: Self-pay | Source: Ambulatory Visit | Attending: Orthopedic Surgery

## 2015-01-22 ENCOUNTER — Inpatient Hospital Stay (HOSPITAL_COMMUNITY): Payer: BLUE CROSS/BLUE SHIELD | Admitting: Vascular Surgery

## 2015-01-22 ENCOUNTER — Encounter (HOSPITAL_COMMUNITY): Payer: Self-pay | Admitting: Anesthesiology

## 2015-01-22 ENCOUNTER — Inpatient Hospital Stay (HOSPITAL_COMMUNITY)
Admission: AD | Admit: 2015-01-22 | Discharge: 2015-01-24 | DRG: 483 | Disposition: A | Discharge status: Home / Self Care | Payer: BLUE CROSS/BLUE SHIELD | Source: Ambulatory Visit | Attending: Orthopedic Surgery | Admitting: Orthopedic Surgery

## 2015-01-22 DIAGNOSIS — Z885 Allergy status to narcotic agent status: Secondary | ICD-10-CM | POA: Diagnosis not present

## 2015-01-22 DIAGNOSIS — M19012 Primary osteoarthritis, left shoulder: Principal | ICD-10-CM | POA: Diagnosis present

## 2015-01-22 DIAGNOSIS — Z886 Allergy status to analgesic agent status: Secondary | ICD-10-CM | POA: Diagnosis not present

## 2015-01-22 DIAGNOSIS — K219 Gastro-esophageal reflux disease without esophagitis: Secondary | ICD-10-CM | POA: Diagnosis present

## 2015-01-22 DIAGNOSIS — Z96619 Presence of unspecified artificial shoulder joint: Secondary | ICD-10-CM

## 2015-01-22 DIAGNOSIS — Z87891 Personal history of nicotine dependence: Secondary | ICD-10-CM | POA: Diagnosis not present

## 2015-01-22 DIAGNOSIS — Z96612 Presence of left artificial shoulder joint: Secondary | ICD-10-CM

## 2015-01-22 HISTORY — PX: TOTAL SHOULDER ARTHROPLASTY: SHX126

## 2015-01-22 SURGERY — ARTHROPLASTY, SHOULDER, TOTAL
Anesthesia: Regional | Site: Shoulder | Laterality: Left

## 2015-01-22 MED ORDER — OXYCODONE HCL 5 MG PO TABS
5.0000 mg | ORAL_TABLET | ORAL | Status: DC | PRN
  Administered 2015-01-22 – 2015-01-23 (×3): 10 mg via ORAL
  Administered 2015-01-23 (×2): 5 mg via ORAL
  Administered 2015-01-24: 10 mg via ORAL
  Administered 2015-01-24: 5 mg via ORAL
  Filled 2015-01-22: qty 1
  Filled 2015-01-22 (×3): qty 2
  Filled 2015-01-22 (×2): qty 1
  Filled 2015-01-22: qty 2

## 2015-01-22 MED ORDER — NEOSTIGMINE METHYLSULFATE 10 MG/10ML IV SOLN
INTRAVENOUS | Status: DC | PRN
  Administered 2015-01-22: 3 mg via INTRAVENOUS

## 2015-01-22 MED ORDER — METHOCARBAMOL 1000 MG/10ML IJ SOLN
500.0000 mg | Freq: Four times a day (QID) | INTRAVENOUS | Status: DC | PRN
  Filled 2015-01-22: qty 5

## 2015-01-22 MED ORDER — ONDANSETRON HCL 4 MG PO TABS: 4.0000 mg | ORAL_TABLET | Freq: Four times a day (QID) | ORAL | Status: DC | PRN

## 2015-01-22 MED ORDER — METHOCARBAMOL 500 MG PO TABS: 500.0000 mg | ORAL_TABLET | Freq: Three times a day (TID) | ORAL | Status: DC | PRN

## 2015-01-22 MED ORDER — SCOPOLAMINE 1 MG/3DAYS TD PT72
MEDICATED_PATCH | TRANSDERMAL | Status: AC
  Filled 2015-01-22: qty 1

## 2015-01-22 MED ORDER — FENTANYL CITRATE (PF) 100 MCG/2ML IJ SOLN
INTRAMUSCULAR | Status: DC | PRN
  Administered 2015-01-22: 50 ug via INTRAVENOUS

## 2015-01-22 MED ORDER — HYDROMORPHONE HCL 1 MG/ML IJ SOLN
0.2500 mg | INTRAMUSCULAR | Status: DC | PRN
  Administered 2015-01-22: 0.5 mg via INTRAVENOUS

## 2015-01-22 MED ORDER — ACETAMINOPHEN 325 MG PO TABS: 650.0000 mg | ORAL_TABLET | Freq: Four times a day (QID) | ORAL | Status: DC | PRN

## 2015-01-22 MED ORDER — ONDANSETRON HCL 4 MG/2ML IJ SOLN: 4.0000 mg | Freq: Four times a day (QID) | INTRAMUSCULAR | Status: DC | PRN

## 2015-01-22 MED ORDER — ACETAMINOPHEN 650 MG RE SUPP: 650.0000 mg | Freq: Four times a day (QID) | RECTAL | Status: DC | PRN

## 2015-01-22 MED ORDER — SODIUM CHLORIDE 0.9 % IV SOLN
INTRAVENOUS | Status: DC
  Administered 2015-01-22: 16:00:00 via INTRAVENOUS

## 2015-01-22 MED ORDER — FENTANYL CITRATE (PF) 100 MCG/2ML IJ SOLN
INTRAMUSCULAR | Status: AC
  Administered 2015-01-22: 50 ug via INTRAVENOUS
  Filled 2015-01-22: qty 2

## 2015-01-22 MED ORDER — LIDOCAINE HCL (CARDIAC) 20 MG/ML IV SOLN
INTRAVENOUS | Status: AC
  Filled 2015-01-22: qty 5

## 2015-01-22 MED ORDER — HYDROCODONE-ACETAMINOPHEN 5-325 MG PO TABS
1.0000 | ORAL_TABLET | Freq: Four times a day (QID) | ORAL | Status: DC | PRN
  Administered 2015-01-23 (×2): 1 via ORAL
  Filled 2015-01-22 (×2): qty 1

## 2015-01-22 MED ORDER — DEXAMETHASONE SODIUM PHOSPHATE 4 MG/ML IJ SOLN
INTRAMUSCULAR | Status: AC
  Filled 2015-01-22: qty 1

## 2015-01-22 MED ORDER — BUPIVACAINE-EPINEPHRINE 0.25% -1:200000 IJ SOLN
INTRAMUSCULAR | Status: DC | PRN
  Administered 2015-01-22: 8 mL

## 2015-01-22 MED ORDER — BUPIVACAINE-EPINEPHRINE (PF) 0.5% -1:200000 IJ SOLN
INTRAMUSCULAR | Status: DC | PRN
  Administered 2015-01-22: 30 mL via PERINEURAL

## 2015-01-22 MED ORDER — PANTOPRAZOLE SODIUM 40 MG PO TBEC
40.0000 mg | DELAYED_RELEASE_TABLET | Freq: Every day | ORAL | Status: DC
  Administered 2015-01-22 – 2015-01-24 (×3): 40 mg via ORAL
  Filled 2015-01-22 (×3): qty 1

## 2015-01-22 MED ORDER — GLYCOPYRROLATE 0.2 MG/ML IJ SOLN
INTRAMUSCULAR | Status: AC
  Filled 2015-01-22: qty 2

## 2015-01-22 MED ORDER — CITALOPRAM HYDROBROMIDE 40 MG PO TABS
40.0000 mg | ORAL_TABLET | Freq: Every day | ORAL | Status: DC
  Administered 2015-01-22 – 2015-01-24 (×3): 40 mg via ORAL
  Filled 2015-01-22 (×3): qty 1

## 2015-01-22 MED ORDER — MIDAZOLAM HCL 2 MG/2ML IJ SOLN
INTRAMUSCULAR | Status: AC
  Administered 2015-01-22: 2 mg via INTRAVENOUS
  Filled 2015-01-22: qty 2

## 2015-01-22 MED ORDER — BISACODYL 10 MG RE SUPP: 10.0000 mg | Freq: Every day | RECTAL | Status: DC | PRN

## 2015-01-22 MED ORDER — DEXAMETHASONE SODIUM PHOSPHATE 4 MG/ML IJ SOLN
INTRAMUSCULAR | Status: DC | PRN
  Administered 2015-01-22: 4 mg via INTRAVENOUS

## 2015-01-22 MED ORDER — SCOPOLAMINE 1 MG/3DAYS TD PT72
1.0000 | MEDICATED_PATCH | Freq: Once | TRANSDERMAL | Status: AC
  Administered 2015-01-22: 1 via TRANSDERMAL

## 2015-01-22 MED ORDER — PROPOFOL 10 MG/ML IV BOLUS
INTRAVENOUS | Status: AC
  Filled 2015-01-22: qty 20

## 2015-01-22 MED ORDER — BUPIVACAINE-EPINEPHRINE (PF) 0.25% -1:200000 IJ SOLN
INTRAMUSCULAR | Status: AC
  Filled 2015-01-22: qty 30

## 2015-01-22 MED ORDER — SODIUM CHLORIDE 0.9 % IR SOLN
Status: DC | PRN
  Administered 2015-01-22: 1000 mL

## 2015-01-22 MED ORDER — CEFAZOLIN SODIUM-DEXTROSE 2-3 GM-% IV SOLR
2.0000 g | Freq: Four times a day (QID) | INTRAVENOUS | Status: DC
  Filled 2015-01-22 (×4): qty 50

## 2015-01-22 MED ORDER — POLYETHYLENE GLYCOL 3350 17 G PO PACK: 17.0000 g | PACK | Freq: Every day | ORAL | Status: DC | PRN

## 2015-01-22 MED ORDER — CEFAZOLIN SODIUM-DEXTROSE 2-3 GM-% IV SOLR
2.0000 g | Freq: Four times a day (QID) | INTRAVENOUS | Status: AC
  Administered 2015-01-22 – 2015-01-23 (×2): 2 g via INTRAVENOUS
  Filled 2015-01-22 (×2): qty 50

## 2015-01-22 MED ORDER — MIDAZOLAM HCL 2 MG/2ML IJ SOLN
1.0000 mg | INTRAMUSCULAR | Status: DC | PRN
  Administered 2015-01-22: 2 mg via INTRAVENOUS

## 2015-01-22 MED ORDER — PHENOL 1.4 % MT LIQD: 1.0000 | OROMUCOSAL | Status: DC | PRN

## 2015-01-22 MED ORDER — DOCUSATE SODIUM 100 MG PO CAPS
100.0000 mg | ORAL_CAPSULE | Freq: Two times a day (BID) | ORAL | Status: DC
  Administered 2015-01-22 – 2015-01-24 (×5): 100 mg via ORAL
  Filled 2015-01-22 (×5): qty 1

## 2015-01-22 MED ORDER — ONDANSETRON HCL 4 MG/2ML IJ SOLN
INTRAMUSCULAR | Status: AC
  Filled 2015-01-22: qty 2

## 2015-01-22 MED ORDER — GLYCOPYRROLATE 0.2 MG/ML IJ SOLN
INTRAMUSCULAR | Status: DC | PRN
  Administered 2015-01-22: 0.2 mg via INTRAVENOUS
  Administered 2015-01-22: 0.4 mg via INTRAVENOUS

## 2015-01-22 MED ORDER — ROCURONIUM BROMIDE 50 MG/5ML IV SOLN
INTRAVENOUS | Status: AC
  Filled 2015-01-22: qty 1

## 2015-01-22 MED ORDER — METHOCARBAMOL 500 MG PO TABS
500.0000 mg | ORAL_TABLET | Freq: Four times a day (QID) | ORAL | Status: DC | PRN
  Administered 2015-01-23 (×2): 500 mg via ORAL
  Filled 2015-01-22 (×2): qty 1

## 2015-01-22 MED ORDER — ROCURONIUM BROMIDE 100 MG/10ML IV SOLN
INTRAVENOUS | Status: DC | PRN
  Administered 2015-01-22: 30 mg via INTRAVENOUS

## 2015-01-22 MED ORDER — THROMBIN 5000 UNITS EX SOLR
CUTANEOUS | Status: AC
  Filled 2015-01-22: qty 5000

## 2015-01-22 MED ORDER — OXYCODONE-ACETAMINOPHEN 5-325 MG PO TABS: 1.0000 | ORAL_TABLET | ORAL | Status: DC | PRN

## 2015-01-22 MED ORDER — FENTANYL CITRATE (PF) 100 MCG/2ML IJ SOLN
50.0000 ug | INTRAMUSCULAR | Status: DC | PRN
  Administered 2015-01-22: 50 ug via INTRAVENOUS

## 2015-01-22 MED ORDER — FENTANYL CITRATE (PF) 250 MCG/5ML IJ SOLN
INTRAMUSCULAR | Status: AC
  Filled 2015-01-22: qty 5

## 2015-01-22 MED ORDER — HYDROMORPHONE HCL 1 MG/ML IJ SOLN
INTRAMUSCULAR | Status: AC
  Filled 2015-01-22: qty 1

## 2015-01-22 MED ORDER — METOCLOPRAMIDE HCL 5 MG/ML IJ SOLN: 5.0000 mg | Freq: Three times a day (TID) | INTRAMUSCULAR | Status: DC | PRN

## 2015-01-22 MED ORDER — THROMBIN 5000 UNITS EX SOLR
CUTANEOUS | Status: DC | PRN
  Administered 2015-01-22: 5000 [IU] via TOPICAL

## 2015-01-22 MED ORDER — HYDROMORPHONE HCL 1 MG/ML IJ SOLN
1.0000 mg | INTRAMUSCULAR | Status: DC | PRN
  Administered 2015-01-23: 1 mg via INTRAVENOUS
  Filled 2015-01-22: qty 1

## 2015-01-22 MED ORDER — LIDOCAINE HCL (CARDIAC) 20 MG/ML IV SOLN
INTRAVENOUS | Status: DC | PRN
  Administered 2015-01-22: 20 mg via INTRAVENOUS
  Administered 2015-01-22: 60 mg via INTRAVENOUS

## 2015-01-22 MED ORDER — NABUMETONE 500 MG PO TABS
500.0000 mg | ORAL_TABLET | Freq: Two times a day (BID) | ORAL | Status: DC
  Administered 2015-01-22 – 2015-01-24 (×4): 500 mg via ORAL
  Filled 2015-01-22 (×6): qty 1

## 2015-01-22 MED ORDER — LACTATED RINGERS IV SOLN
INTRAVENOUS | Status: DC
  Administered 2015-01-22: 09:00:00 via INTRAVENOUS

## 2015-01-22 MED ORDER — PROPOFOL 10 MG/ML IV BOLUS
INTRAVENOUS | Status: DC | PRN
  Administered 2015-01-22: 200 mg via INTRAVENOUS

## 2015-01-22 MED ORDER — PHENYLEPHRINE HCL 10 MG/ML IJ SOLN
10.0000 mg | INTRAVENOUS | Status: DC | PRN
  Administered 2015-01-22: 50 ug/min via INTRAVENOUS

## 2015-01-22 MED ORDER — MENTHOL 3 MG MT LOZG: 1.0000 | LOZENGE | OROMUCOSAL | Status: DC | PRN

## 2015-01-22 MED ORDER — ASPIRIN EC 81 MG PO TBEC
81.0000 mg | DELAYED_RELEASE_TABLET | Freq: Every day | ORAL | Status: DC
  Administered 2015-01-22 – 2015-01-24 (×3): 81 mg via ORAL
  Filled 2015-01-22 (×3): qty 1

## 2015-01-22 MED ORDER — TRAMADOL HCL 50 MG PO TABS: 50.0000 mg | ORAL_TABLET | Freq: Four times a day (QID) | ORAL | Status: DC | PRN

## 2015-01-22 MED ORDER — ONDANSETRON HCL 4 MG/2ML IJ SOLN
INTRAMUSCULAR | Status: DC | PRN
  Administered 2015-01-22: 4 mg via INTRAVENOUS

## 2015-01-22 MED ORDER — METOCLOPRAMIDE HCL 5 MG PO TABS: 5.0000 mg | ORAL_TABLET | Freq: Three times a day (TID) | ORAL | Status: DC | PRN

## 2015-01-22 SURGICAL SUPPLY — 69 items
BLADE SAW SAG 73X25 THK (BLADE) ×1
BLADE SAW SGTL 73X25 THK (BLADE) ×1 IMPLANT
BUR SURG 4X8 MED (BURR) IMPLANT
BURR SURG 4X8 MED (BURR)
CAPT SHLDR TOTAL 2 ×2 IMPLANT
CEMENT BONE DEPUY (Cement) ×2 IMPLANT
CLSR STERI-STRIP ANTIMIC 1/2X4 (GAUZE/BANDAGES/DRESSINGS) ×2 IMPLANT
COVER SURGICAL LIGHT HANDLE (MISCELLANEOUS) ×2 IMPLANT
DRAPE IMP U-DRAPE 54X76 (DRAPES) ×2 IMPLANT
DRAPE INCISE IOBAN 66X45 STRL (DRAPES) ×2 IMPLANT
DRAPE U-SHAPE 47X51 STRL (DRAPES) ×2 IMPLANT
DRAPE X-RAY CASS 24X20 (DRAPES) IMPLANT
DRILL BIT 5/64 (BIT) ×2 IMPLANT
DRSG ADAPTIC 3X8 NADH LF (GAUZE/BANDAGES/DRESSINGS) ×2 IMPLANT
DRSG PAD ABDOMINAL 8X10 ST (GAUZE/BANDAGES/DRESSINGS) ×4 IMPLANT
DURAPREP 26ML APPLICATOR (WOUND CARE) ×2 IMPLANT
ELECT BLADE 4.0 EZ CLEAN MEGAD (MISCELLANEOUS) ×2
ELECT NEEDLE TIP 2.8 STRL (NEEDLE) ×2 IMPLANT
ELECT REM PT RETURN 9FT ADLT (ELECTROSURGICAL) ×2
ELECTRODE BLDE 4.0 EZ CLN MEGD (MISCELLANEOUS) ×1 IMPLANT
ELECTRODE REM PT RTRN 9FT ADLT (ELECTROSURGICAL) ×1 IMPLANT
GAUZE SPONGE 4X4 12PLY STRL (GAUZE/BANDAGES/DRESSINGS) IMPLANT
GLOVE BIOGEL PI ORTHO PRO 7.5 (GLOVE) ×1
GLOVE BIOGEL PI ORTHO PRO SZ8 (GLOVE) ×1
GLOVE ORTHO TXT STRL SZ7.5 (GLOVE) ×2 IMPLANT
GLOVE PI ORTHO PRO STRL 7.5 (GLOVE) ×1 IMPLANT
GLOVE PI ORTHO PRO STRL SZ8 (GLOVE) ×1 IMPLANT
GLOVE SURG ORTHO 8.5 STRL (GLOVE) ×4 IMPLANT
GOWN STRL REUS W/ TWL XL LVL3 (GOWN DISPOSABLE) ×3 IMPLANT
GOWN STRL REUS W/TWL XL LVL3 (GOWN DISPOSABLE) ×3
HANDPIECE INTERPULSE COAX TIP (DISPOSABLE)
KIT BASIN OR (CUSTOM PROCEDURE TRAY) ×2 IMPLANT
KIT ROOM TURNOVER OR (KITS) ×2 IMPLANT
MANIFOLD NEPTUNE II (INSTRUMENTS) ×2 IMPLANT
NDL SUT 6 .5 CRC .975X.05 MAYO (NEEDLE) ×1 IMPLANT
NEEDLE 1/2 CIR MAYO (NEEDLE) ×2 IMPLANT
NEEDLE HYPO 25GX1X1/2 BEV (NEEDLE) ×2 IMPLANT
NEEDLE MAYO TAPER (NEEDLE) ×1
NS IRRIG 1000ML POUR BTL (IV SOLUTION) ×2 IMPLANT
PACK SHOULDER (CUSTOM PROCEDURE TRAY) ×2 IMPLANT
PACK UNIVERSAL I (CUSTOM PROCEDURE TRAY) ×2 IMPLANT
PAD ARMBOARD 7.5X6 YLW CONV (MISCELLANEOUS) ×2 IMPLANT
PIN METAGLENE 2.5 (PIN) ×2 IMPLANT
SET HNDPC FAN SPRY TIP SCT (DISPOSABLE) IMPLANT
SLING ARM IMMOBILIZER LRG (SOFTGOODS) IMPLANT
SLING ARM IMMOBILIZER MED (SOFTGOODS) IMPLANT
SMARTMIX MINI TOWER (MISCELLANEOUS)
SPONGE GAUZE 4X4 12PLY STER LF (GAUZE/BANDAGES/DRESSINGS) ×2 IMPLANT
SPONGE LAP 18X18 X RAY DECT (DISPOSABLE) ×2 IMPLANT
SPONGE LAP 4X18 X RAY DECT (DISPOSABLE) ×2 IMPLANT
SPONGE SURGIFOAM ABS GEL SZ50 (HEMOSTASIS) ×2 IMPLANT
STRIP CLOSURE SKIN 1/2X4 (GAUZE/BANDAGES/DRESSINGS) IMPLANT
SUCTION FRAZIER TIP 10 FR DISP (SUCTIONS) ×2 IMPLANT
SUT FIBERWIRE #2 38 T-5 BLUE (SUTURE) ×4
SUT MNCRL AB 4-0 PS2 18 (SUTURE) ×2 IMPLANT
SUT VIC AB 0 CT1 27 (SUTURE) ×1
SUT VIC AB 0 CT1 27XBRD ANBCTR (SUTURE) ×1 IMPLANT
SUT VIC AB 2-0 CT1 27 (SUTURE) ×1
SUT VIC AB 2-0 CT1 TAPERPNT 27 (SUTURE) ×1 IMPLANT
SUT VICRYL AB 2 0 TIES (SUTURE) ×2 IMPLANT
SUTURE FIBERWR #2 38 T-5 BLUE (SUTURE) ×2 IMPLANT
SYR CONTROL 10ML LL (SYRINGE) ×2 IMPLANT
TOWEL OR 17X24 6PK STRL BLUE (TOWEL DISPOSABLE) ×2 IMPLANT
TOWEL OR 17X26 10 PK STRL BLUE (TOWEL DISPOSABLE) ×2 IMPLANT
TOWER SMARTMIX MINI (MISCELLANEOUS) IMPLANT
TRAY FOLEY CATH 16FRSI W/METER (SET/KITS/TRAYS/PACK) ×4 IMPLANT
TUBE CONNECTING 12X1/4 (SUCTIONS) ×2 IMPLANT
WATER STERILE IRR 1000ML POUR (IV SOLUTION) ×2 IMPLANT
YANKAUER SUCT BULB TIP NO VENT (SUCTIONS) ×8 IMPLANT

## 2015-01-22 NOTE — Anesthesia Preprocedure Evaluation (Addendum)
Anesthesia Evaluation  Patient identified by MRN, date of birth, ID band Patient awake    Reviewed: Allergy & Precautions, H&P , NPO status , Patient's Chart, lab work & pertinent test results  Airway Mallampati: II  TM Distance: >3 FB Neck ROM: Full    Dental no notable dental hx. (+) Teeth Intact, Dental Advisory Given   Pulmonary neg pulmonary ROS, former smoker,    Pulmonary exam normal breath sounds clear to auscultation       Cardiovascular negative cardio ROS   Rhythm:Regular Rate:Normal     Neuro/Psych negative neurological ROS  negative psych ROS   GI/Hepatic Neg liver ROS, GERD  Medicated and Controlled,  Endo/Other  negative endocrine ROS  Renal/GU negative Renal ROS  negative genitourinary   Musculoskeletal  (+) Arthritis , Osteoarthritis,    Abdominal   Peds  Hematology negative hematology ROS (+)   Anesthesia Other Findings   Reproductive/Obstetrics negative OB ROS                           Anesthesia Physical Anesthesia Plan  ASA: II  Anesthesia Plan: General and Regional   Post-op Pain Management: GA combined w/ Regional for post-op pain   Induction: Intravenous  Airway Management Planned: Oral ETT  Additional Equipment:   Intra-op Plan:   Post-operative Plan: Extubation in OR  Informed Consent: I have reviewed the patients History and Physical, chart, labs and discussed the procedure including the risks, benefits and alternatives for the proposed anesthesia with the patient or authorized representative who has indicated his/her understanding and acceptance.   Dental advisory given  Plan Discussed with: CRNA  Anesthesia Plan Comments:         Anesthesia Quick Evaluation

## 2015-01-22 NOTE — Transfer of Care (Signed)
Immediate Anesthesia Transfer of Care Note  Patient: Anna Hodges  Procedure(s) Performed: Procedure(s): LEFT TOTAL SHOULDER ARTHROPLASTY (Left)  Patient Location: PACU  Anesthesia Type:General  Level of Consciousness: awake, alert  and oriented  Airway & Oxygen Therapy: Patient Spontanous Breathing and Patient connected to nasal cannula oxygen  Post-op Assessment: Report given to RN, Post -op Vital signs reviewed and stable and Patient moving all extremities  Post vital signs: Reviewed and stable  Last Vitals:  Filed Vitals:   01/22/15 0910 01/22/15 0915  BP: 117/69 124/66  Pulse: 88 86  Temp:    Resp: 23 18    Complications: No apparent anesthesia complications

## 2015-01-22 NOTE — Op Note (Signed)
Anna Hodges, Anna Hodges NO.:  [id number]  MEDICAL RECORD NO.:  [id number]  LOCATION:  [facility]                         FACILITY:  [facility]  PHYSICIAN:  Doran Heater. Veverly Fells, M.D. DATE OF BIRTH:  12/22/1950  DATE OF PROCEDURE:  01/22/2015 DATE OF DISCHARGE:                              OPERATIVE REPORT   PREOPERATIVE DIAGNOSIS:  Left shoulder end-stage osteoarthritis.  POSTOPERATIVE DIAGNOSIS:  Left shoulder end-stage osteoarthritis.  PROCEDURE PERFORMED:  Left total shoulder arthroplasty, using DePuy Global Unite system.  ATTENDING SURGEON:  Doran Heater. Veverly Fells, M.D.  ASSISTANT:  Charletta Cousin Dixon, [place], who scrubbed the entire procedure and necessary for satisfactory completion of surgery.  ANESTHESIA:  General anesthesia was used plus interscalene block.  ESTIMATED BLOOD LOSS:  150 mL.  FLUID REPLACEMENT:  1200 mL crystalloid.  INSTRUMENT COUNTS:  Correct.  COMPLICATIONS:  There were no complications.  ANTIBIOTICS:  Perioperative antibiotics were given.  INDICATIONS:  The patient is a 65 year old female, with worsening left shoulder pain, secondary to end-stage arthritis.  The patient has bone- on-bone findings on x-ray.  Has failed all measures of conservative management including modification activity, anti-inflammatories, injections, and pain medication.  The patient presents with refractory shoulder pain and dysfunction, secondary to a severe bone-on-bone arthritis.  Risks and benefits of surgery discussed.  Informed consent obtained.  DESCRIPTION OF PROCEDURE:  After an adequate level of anesthesia achieved, the patient was positioned in a modified beach-chair position. Left shoulder correctly identified and sterilely prepped and draped in the usual manner.  Time-out was called.  We then entered the shoulder using a standard deltopectoral approach, starting at the coracoid process, extending down to the anterior humerus.  Dissection down to  the subcutaneous tissues using the Bovie.  We identified the cephalic vein, took it laterally to the deltoid, pectoralis taken medially, upper centimeter of pectoralis was released.  Conjoined tendon was identified and retracted medially.  The subscapularis was taken off subperiosteally off the lesser tuberosity and #2 FiberWire sutures, placed in a modified W stitch for repair at the end.  Progressive soft tissue peel off the inferior humeral neck, all the way around to the posterior position.  We then went ahead and placed our deep retractors and placed a T-handled Crego elevator over the top of the humeral head, underneath the rotator cuff to protect it at the greater tuberosity area and then also a large Crego on the medial humeral neck.  We then placed an alignment guide for making our neck cut and then we planned our cut to be flushed with the rotator cuff insertion on the greater tuberosity and using oscillating saw made that cut again protecting all neurovascular structures.  We then removed the osteophytes off the inferior humerus, all the way around to the posterior position.  We then went ahead and sequentially reamed up to a size 10 diameter reaming.  We then broached both with the 8 and the 10 for our Global Unite stem and looked provisionally at our coverage.  We felt like we need to be a 40 or a 44 humeral component. We then went ahead and subluxed the humerus, so we removed the trial components, subluxed the humerus posteriorly  and then did a 360-degree glenoid labrum removal, biceps tenotomy and tenodesis.  We removed the proximal intra-articular biceps.  We tenodesed the appropriate tension at the pectoralis insertion area with just #0 Vicryl suture figure-of- eight x2.  Next, we went ahead and found our center point for the glenoid.  There was complete bone cartilage loss on both the humeral side with loss of the normal curvature of the humeral head and on the glenoid  side, there was just intense granulation tissue, where all the cartilage had been basically worn away, so the remaining beat up labrum was removed.  We removed the granulation tissue, found the center point for the glenoid and drilled that with a guide pin.  We then reamed for the 40 Anchor Peg glenoid and then did our peripheral hand reaming, got down a nice bleeding bow, but still had good subchondral bone support.  We then drilled our central peg hole.  Placed our peripheral hole drill guide and drilled our superior and anterior inferior PEG holes.  We went ahead and dried those with our suction and then placed epi soaked gel foam in those 3 peripheral holes and then vacuum mixed DePuy 1 cement on the back table and then injected that into the 3 peripheral holes and impacted the APG glenoid in place, held that until the cement was hard on the back table.  We then checked for excess cement, removed that, irrigated the shoulder thoroughly and then went ahead and did the drill holes in the lesser tuberosity with #2 FiberWire suture in for repair of the subscapularis.  We then used impaction grafting technique and impacted the pore coat Global Unite stem size 10 with the 10 metaphysis into the appropriate version, which is about 30 degrees of retroversion. Once that was impacted and flushed with the bone cut, we then trailed again with a 40 and then decide on 4415.  We did multiple trials there to make sure that we would have good coverage, affective coverage of the exposed bone, proximally and then also that we were not overstuffing the joint.  Once we had that real implant dialed superiorly and posteriorly in place and impacted, we reduced the shoulder.  We then repaired the subscap anatomically back to bone with repair the rotator interval.  We had a nice solid repair.  We could axially rotate the 34, elevate to 120, and abduct to 90, all without any impingement.  We  thoroughly irrigated the shoulder and then repaired deltopectoral interval with 0 Vicryl suture, followed by 2-0 Vicryl subcutaneous closure, and 4-0 Monocryl for skin.  Steri-Strips applied, followed by sterile dressing. The patient tolerated surgery well.     Doran Heater. Veverly Fells, M.D.     SRN/MEDQ  D:  01/22/2015  T:  01/22/2015  Job:  [id number]

## 2015-01-22 NOTE — Interval H&P Note (Signed)
History and Physical Interval Note:  01/22/2015 9:35 AM  Anna Hodges  has presented today for surgery, with the diagnosis of LEFT SHOULER OA  The various methods of treatment have been discussed with the patient and family. After consideration of risks, benefits and other options for treatment, the patient has consented to  Procedure(s): LEFT TOTAL SHOULDER ARTHROPLASTY (Left) as a surgical intervention .  The patient's history has been reviewed, patient examined, no change in status, stable for surgery.  I have reviewed the patient's chart and labs.  Questions were answered to the patient's satisfaction.     Sahej Schrieber,STEVEN R

## 2015-01-22 NOTE — Anesthesia Procedure Notes (Addendum)
Anesthesia Regional Block:  Interscalene brachial plexus block  Pre-Anesthetic Checklist: ,, timeout performed, Correct Patient, Correct Site, Correct Laterality, Correct Procedure, Correct Position, site marked, Risks and benefits discussed, pre-op evaluation,  At surgeon's request and post-op pain management  Laterality: Left  Prep: Maximum Sterile Barrier Precautions used and chloraprep       Needles:  Injection technique: Single-shot  Needle Type: Echogenic Stimulator Needle     Needle Length: 5cm 5 cm Needle Gauge: 22 and 22 G    Additional Needles:  Procedures: ultrasound guided (picture in chart) and nerve stimulator Interscalene brachial plexus block  Nerve Stimulator or Paresthesia:  Response: Biceps response,   Additional Responses:   Narrative:  Start time: 01/22/2015 9:04 AM End time: 01/22/2015 9:14 AM Injection made incrementally with aspirations every 5 mL. Anesthesiologist: Roderic Palau  Additional Notes: 2% Lidocaine skin wheel.    Procedure Name: Intubation Date/Time: 01/22/2015 9:59 AM Performed by: Kyung Rudd Pre-anesthesia Checklist: Patient identified, Emergency Drugs available, Suction available, Patient being monitored and Timeout performed Patient Re-evaluated:Patient Re-evaluated prior to inductionOxygen Delivery Method: Circle system utilized Preoxygenation: Pre-oxygenation with 100% oxygen Intubation Type: IV induction Ventilation: Mask ventilation without difficulty Laryngoscope Size: Mac and 3 Grade View: Grade II Tube type: Oral Tube size: 7.0 mm Number of attempts: 1 Airway Equipment and Method: Stylet and LTA kit utilized Placement Confirmation: ETT inserted through vocal cords under direct vision,  positive ETCO2 and breath sounds checked- equal and bilateral Secured at: 21 cm Tube secured with: Tape Dental Injury: Teeth and Oropharynx as per pre-operative assessment

## 2015-01-22 NOTE — Progress Notes (Signed)
Utilization review completed.  

## 2015-01-22 NOTE — Brief Op Note (Signed)
01/22/2015  12:09 PM  PATIENT:  Anna Hodges  65 y.o. female  PRE-OPERATIVE DIAGNOSIS:  LEFT SHOULER OA, END STAGE  POST-OPERATIVE DIAGNOSIS:  LEFT SHOULER OA, END STAGE  PROCEDURE:  Procedure(s): LEFT TOTAL SHOULDER ARTHROPLASTY (Left) DePuy Global Unite  SURGEON:  Surgeon(s) and Role:    * Netta Cedars, MD - Primary  PHYSICIAN ASSISTANT:   ASSISTANTS: [place] Bruns, PA-C   ANESTHESIA:   regional and general  EBL:  Total I/O In: 600 [I.V.:600] Out: -   BLOOD ADMINISTERED:none  DRAINS: none   LOCAL MEDICATIONS USED:  MARCAINE     SPECIMEN:  No Specimen  DISPOSITION OF SPECIMEN:  N/A  COUNTS:  YES  TOURNIQUET:  * No tourniquets in log *  DICTATION: .Other Dictation: Dictation Number [id number]  PLAN OF CARE: Admit to inpatient   PATIENT DISPOSITION:  PACU - hemodynamically stable.   Delay start of Pharmacological VTE agent (>24hrs) due to surgical blood loss or risk of bleeding: no

## 2015-01-22 NOTE — Discharge Instructions (Signed)
Ice to the shoulder.  Ok to remove the sling in the house.  Must be worn out of the house.  Hug a pillow in the house.  It is better to sleep upright in a recliner or propped up in bed or on a sofa.  Keep the shoulder incision covered and clean and dry for one week, then ok to get it wet in the shower.  Do exercises every hour while awake, lap slides, gentle rotation exercises, ok moving the hand to the face  Follow up with Dr Veverly Fells in two weeks  [phone]

## 2015-01-22 NOTE — Anesthesia Postprocedure Evaluation (Signed)
Anesthesia Post Note  Patient: Anna Hodges  Procedure(s) Performed: Procedure(s) (LRB): LEFT TOTAL SHOULDER ARTHROPLASTY (Left)  Patient location during evaluation: PACU Anesthesia Type: General and Regional Level of consciousness: awake and alert Pain management: pain level controlled Vital Signs Assessment: post-procedure vital signs reviewed and stable Respiratory status: spontaneous breathing, nonlabored ventilation, respiratory function stable and patient connected to nasal cannula oxygen Cardiovascular status: blood pressure returned to baseline and stable Postop Assessment: no signs of nausea or vomiting Anesthetic complications: no    Last Vitals:  Filed Vitals:   01/22/15 1230 01/22/15 1300  BP: 97/49 105/64  Pulse: 80 79  Temp:    Resp: 24 21    Last Pain:  Filed Vitals:   01/22/15 1304  PainSc: 7                  Dmya Long,W. EDMOND

## 2015-01-23 LAB — BASIC METABOLIC PANEL
Anion gap: 8 (ref 5–15)
BUN: 9 mg/dL (ref 6–20)
CO2: 25 mmol/L (ref 22–32)
Calcium: 8.7 mg/dL — ABNORMAL LOW (ref 8.9–10.3)
Chloride: 104 mmol/L (ref 101–111)
Creatinine, Ser: 0.62 mg/dL (ref 0.44–1.00)
GFR calc Af Amer: >60 mL/min (ref >60)
GFR calc non Af Amer: >60 mL/min (ref >60)
Glucose, Bld: 106 mg/dL — ABNORMAL HIGH (ref 65–99)
Potassium: 4 mmol/L (ref 3.5–5.1)
Sodium: 137 mmol/L (ref 135–145)

## 2015-01-23 LAB — HEMOGLOBIN AND HEMATOCRIT, BLOOD
HCT: 34 % — ABNORMAL LOW (ref 36.0–46.0)
Hemoglobin: 10.7 g/dL — ABNORMAL LOW (ref 12.0–15.0)

## 2015-01-23 NOTE — Progress Notes (Signed)
   Subjective: 1 Day Post-Op Procedure(s) (LRB): LEFT TOTAL SHOULDER ARTHROPLASTY (Left)  Pt c/o mild to moderate pain but otherwise doing okay Denies any numbness or tingling  Otherwise stable Patient reports pain as moderate.  Objective:   VITALS:   Filed Vitals:   01/23/15 0015 01/23/15 0538  BP: 116/62 105/52  Pulse: 83 82  Temp: 99 F (37.2 C) 98.8 F (37.1 C)  Resp: 18 18    Left shoulder incision healing well nv intact distally Sling in place No rashes or drainage  LABS  Recent Labs  01/23/15 0430  HGB 10.7*  HCT 34.0*     Recent Labs  01/23/15 0430  NA 137  K 4.0  BUN 9  CREATININE 0.62  GLUCOSE 106*     Assessment/Plan: 1 Day Post-Op Procedure(s) (LRB): LEFT TOTAL SHOULDER ARTHROPLASTY (Left) PT/OT today and tomorrow Anticipate d/c home tomorrow F/u in 2 weeks with Dr. Garnette Scheuermann, MPAS, PA-C  01/23/2015, 7:46 AM

## 2015-01-23 NOTE — Progress Notes (Addendum)
Occupational Therapy Evaluation Patient Details Name: Anna Hodges MRN: [id number] DOB: Aug 04, 1950 Today's Date: 01/23/2015    History of Present Illness s/p LEFT TOTAL SHOULDER ARTHROPLASTY   Clinical Impression   Pt admitted with the above diagnoses and presents with below problem list. Pt will benefit from continued acute OT to address the below listed deficits and maximize independence with BADLs prior to d/c home with family. PTA pt was indpendent with ADLs. Pt is currently min A for most ADLs due to functional impairment of LUE, min guard for ambulation. Session details below. OT to follow acutely.      Follow Up Recommendations  Supervision/Assistance - 24 hour;Outpatient OT    Equipment Recommendations  3 in 1 bedside comode    Recommendations for Other Services       Precautions / Restrictions Precautions Precautions: Shoulder Type of Shoulder Precautions: active protocol Shoulder Interventions: Shoulder sling/immobilizer;For comfort Precaution Booklet Issued: Yes (comment) Required Braces or Orthoses: Sling Restrictions Weight Bearing Restrictions: Yes Other Position/Activity Restrictions: LUE NWB      Mobility Bed Mobility Overal bed mobility: Needs Assistance Bed Mobility: Supine to Sit;Sit to Supine     Supine to sit: Min guard;HOB elevated Sit to supine: Min guard;HOB elevated   General bed mobility comments: no physical A needed with HOB in elevated position otherwise would have been min A to powerup trunk.   Transfers Overall transfer level: Needs assistance Equipment used: None Transfers: Sit to/from Stand Sit to Stand: Min guard              Balance Overall balance assessment: Needs assistance Sitting-balance support: No upper extremity supported;Feet supported Sitting balance-Leahy Scale: Good     Standing balance support: No upper extremity supported Standing balance-Leahy Scale: Fair Standing balance comment: mild sway no LOB                             ADL Overall ADL's : Needs assistance/impaired Eating/Feeding: Set up;Sitting   Grooming: Minimal assistance;Sitting;Standing   Upper Body Bathing: Minimal assitance;Sitting   Lower Body Bathing: Minimal assistance;Sit to/from stand   Upper Body Dressing : Minimal assistance;Sitting   Lower Body Dressing: Minimal assistance;Sit to/from stand   Toilet Transfer: Min guard;Ambulation Toilet Transfer Details (indicate cue type and reason): 3n1 over toilet. used arm rest of 3n1 Toileting- Clothing Manipulation and Hygiene: Set up;Sitting/lateral lean   Tub/ Shower Transfer: Tub transfer;Min guard;Ambulation;3 in 1   Functional mobility during ADLs: Min guard General ADL Comments: Pt ambulated in room and to/from bathroom. Mild swaying noted but no LOB. Min guard for safety. Min A for ADLs due to impaired functional use of LUE. Educated on ADLs, sling, and exercise protocol. Discussed having someone with her for ambulating stairs.     Vision     Perception     Praxis      Pertinent Vitals/Pain Pain Assessment: 0-10 Pain Score: 8  Pain Location: L shoulder Pain Descriptors / Indicators: Aching;Sore Pain Intervention(s): Limited activity within patient's tolerance;Monitored during session;Premedicated before session;Repositioned;Patient requesting pain meds-RN notified;RN gave pain meds during session;Utilized relaxation techniques;Ice applied     Hand Dominance Right   Extremity/Trunk Assessment Upper Extremity Assessment Upper Extremity Assessment: LUE deficits/detail LUE Deficits / Details: LEFT TOTAL SHOULDER ARTHROPLASTY       Cervical / Trunk Assessment Cervical / Trunk Assessment: Normal   Communication Communication Communication: No difficulties   Cognition Arousal/Alertness: Awake/alert Behavior During Therapy: WFL for tasks assessed/performed Overall  Cognitive Status: Within Functional Limits for tasks assessed                      General Comments   Pt completed LUE shoulder exercises following active protocol, to toleration. Educated on lap slides.    Exercises Exercises: Shoulder     Shoulder Instructions Shoulder Instructions Donning/doffing shirt without moving shoulder: Minimal assistance Method for sponge bathing under operated UE: Modified independent Donning/doffing sling/immobilizer: Minimal assistance Correct positioning of sling/immobilizer: Minimal assistance ROM for elbow, wrist and digits of operated UE: Modified independent Sling wearing schedule (on at all times/off for ADL's): Modified independent Proper positioning of operated UE when showering: Minimal assistance Positioning of UE while sleeping: Minimal assistance    Home Living Family/patient expects to be discharged to:: Private residence Living Arrangements: Spouse/significant other;Children Available Help at Discharge: Family;Available 24 hours/day Type of Home: House Home Access: Stairs to enter [employer] of Steps: 5 Entrance Stairs-Rails: Right Home Layout: One level     Bathroom Shower/Tub: Teacher, early years/pre: Standard     Home Equipment: None          Prior Functioning/Environment Level of Independence: Independent             OT Diagnosis: Acute pain   OT Problem List: Impaired balance (sitting and/or standing);Decreased knowledge of use of DME or AE;Decreased knowledge of precautions;Impaired UE functional use;Pain   OT Treatment/Interventions: Self-care/ADL training;Therapeutic exercise;DME and/or AE instruction;Therapeutic activities;Patient/family education;Balance training    OT Goals(Current goals can be found in the care plan section) Acute Rehab OT Goals Patient Stated Goal: not stated OT Goal Formulation: With patient Time For Goal Achievement: 01/30/15 Potential to Achieve Goals: Good ADL Goals Pt Will Perform Upper Body Bathing: with modified  independence;sitting Pt Will Perform Upper Body Dressing: with modified independence;sitting Pt Will Transfer to Toilet: with modified independence;ambulating Pt Will Perform Tub/Shower Transfer: Tub transfer;ambulating;3 in 1;with modified independence Pt/caregiver will Perform Home Exercise Program: Left upper extremity;With written HEP provided (active shoulder protocol, handout given)  OT Frequency: Min 3X/week   Barriers to D/C:            Co-evaluation              End of Session Equipment Utilized During Treatment: Other (comment) (sling) Nurse Communication: Patient requests pain meds  Activity Tolerance: Patient limited by pain Patient left: in bed;with call bell/phone within reach;with nursing/sitter in room   Time: 1011-1056 OT Time Calculation (min): 45 min Charges:  OT General Charges $OT Visit: 1 Procedure OT Evaluation $OT Eval Low Complexity: 1 Procedure OT Treatments $Self Care/Home Management : 8-22 mins $Therapeutic Exercise: 8-22 mins G-Codes:    Jaton, Seibert Feb 04, 2015, 11:21 AM

## 2015-01-24 NOTE — Progress Notes (Signed)
Occupational Therapy Treatment Patient Details Name: Anna Hodges MRN: [id number] DOB: 1950-02-11 Today's Date: 01/24/2015    History of present illness s/p LEFT TOTAL SHOULDER ARTHROPLASTY   OT comments  Pt progressing towards acute OT goals. Improved performance with exercises compared to yesterday. ADL and sling education reviewed. D/c plan remains appropriate.   Follow Up Recommendations  Supervision/Assistance - 24 hour;Outpatient OT    Equipment Recommendations  3 in 1 bedside comode    Recommendations for Other Services      Precautions / Restrictions Precautions Precautions: Shoulder Type of Shoulder Precautions: active protocol Shoulder Interventions: Shoulder sling/immobilizer;For comfort Precaution Comments: reviewed Restrictions Weight Bearing Restrictions: Yes Other Position/Activity Restrictions: LUE NWB       Mobility Bed Mobility Overal bed mobility: Needs Assistance Bed Mobility: Supine to Sit;Sit to Supine     Supine to sit: Supervision;HOB elevated Sit to supine: Supervision;HOB elevated      Transfers Overall transfer level: Needs assistance Equipment used: None Transfers: Sit to/from Stand Sit to Stand: Supervision              Balance           Standing balance support: No upper extremity supported Standing balance-Leahy Scale: Good Standing balance comment: balance improved this session compared to yesterday. Pt reporting she feels her balance is better today as well,                    ADL Overall ADL's : Needs assistance/impaired                         Toilet Transfer: Supervision/safety;Ambulation;Comfort height toilet   Toileting- Clothing Manipulation and Hygiene: Set up;Sitting/lateral lean       Functional mobility during ADLs: Supervision/safety General ADL Comments: Pt completed toilet transfer and in-room ambulation as detailed above. Pt completed exercises as detailed below,. Reviewed ADL  and sling education.       Vision                     Perception     Praxis      Cognition   Behavior During Therapy: WFL for tasks assessed/performed Overall Cognitive Status: Within Functional Limits for tasks assessed                       Extremity/Trunk Assessment               Exercises Shoulder Exercises Shoulder Flexion:  (Lap slides, 10 reps LUE, seated) Shoulder ABduction: AAROM;Left;10 reps;Seated Shoulder External Rotation: AAROM;Left;10 reps;Supine Elbow Flexion: AROM;Left;10 reps;Supine Elbow Extension: AROM;Left;10 reps;Supine Donning/doffing shirt without moving shoulder: Minimal assistance Method for sponge bathing under operated UE: Modified independent Donning/doffing sling/immobilizer: Minimal assistance Correct positioning of sling/immobilizer: Minimal assistance ROM for elbow, wrist and digits of operated UE: Modified independent Sling wearing schedule (on at all times/off for ADL's): Modified independent Proper positioning of operated UE when showering: Minimal assistance Positioning of UE while sleeping: Minimal assistance   Shoulder Instructions Shoulder Instructions Donning/doffing shirt without moving shoulder: Minimal assistance Method for sponge bathing under operated UE: Modified independent Donning/doffing sling/immobilizer: Minimal assistance Correct positioning of sling/immobilizer: Minimal assistance ROM for elbow, wrist and digits of operated UE: Modified independent Sling wearing schedule (on at all times/off for ADL's): Modified independent Proper positioning of operated UE when showering: Minimal assistance Positioning of UE while sleeping: Minimal assistance     General Comments  Pertinent Vitals/ Pain       Pain Assessment: 0-10 Pain Score: 3  Pain Location: L shoulder Pain Intervention(s): Limited activity within patient's tolerance;Monitored during session;Repositioned  Home Living                                           Prior Functioning/Environment              Frequency Min 3X/week     Progress Toward Goals  OT Goals(current goals can now be found in the care plan section)  Progress towards OT goals: Progressing toward goals  Acute Rehab OT Goals Patient Stated Goal: not stated OT Goal Formulation: With patient Time For Goal Achievement: 01/30/15 Potential to Achieve Goals: Good ADL Goals Pt Will Perform Upper Body Bathing: with modified independence;sitting Pt Will Perform Upper Body Dressing: with modified independence;sitting Pt Will Transfer to Toilet: with modified independence;ambulating Pt Will Perform Tub/Shower Transfer: Tub transfer;ambulating;3 in 1;with modified independence Pt/caregiver will Perform Home Exercise Program: Left upper extremity;With written HEP provided  Plan Discharge plan remains appropriate    Co-evaluation                 End of Session Equipment Utilized During Treatment: Other (comment) (sling)   Activity Tolerance Patient tolerated treatment well   Patient Left in bed;with call bell/phone within reach   Nurse Communication Patient requests pain meds        Time: [id number] OT Time Calculation (min): 35 min  Charges: OT General Charges $OT Visit: 1 Procedure OT Treatments $Self Care/Home Management : 8-22 mins $Therapeutic Exercise: 8-22 mins  Elleah, Delling 01/24/2015, 2:53 PM

## 2015-01-24 NOTE — Progress Notes (Signed)
Subjective: 2 Days Post-Op Procedure(s) (LRB): LEFT TOTAL SHOULDER ARTHROPLASTY (Left) Patient reports pain as mild to left shoulder.  Progressing with PT and OT. Tolerating PO's. Denies SOB, CP, or F/C.  Objective: Vital signs in last 24 hours: Temp:  [97.8 F (36.6 C)-98.5 F (36.9 C)] 98.2 F (36.8 C) (01/08 0442) Pulse Rate:  [80-85] 80 (01/08 0442) Resp:  [18] 18 (01/08 0442) BP: (105-120)/(47-61) 120/61 mmHg (01/08 0442) SpO2:  [91 %-94 %] 94 % (01/08 0442)  Intake/Output from previous day: 01/07 0701 - 01/08 0700 In: 600 [P.O.:600] Out: -  Intake/Output this shift:     Recent Labs  01/23/15 0430  HGB 10.7*    Recent Labs  01/23/15 0430  HCT 34.0*    Recent Labs  01/23/15 0430  NA 137  K 4.0  CL 104  CO2 25  BUN 9  CREATININE 0.62  GLUCOSE 106*  CALCIUM 8.7*   No results for input(s): LABPT, INR in the last 72 hours.  Alert and oriented x3. RRR, Lungs clear, BS x4. L shoulder dressing C/D/I. No DVT signs. No signs of infection or compartment syndrome. LUE grossly neurovascularly intact.   Assessment/Plan: 2 Days Post-Op Procedure(s) (LRB): LEFT TOTAL SHOULDER ARTHROPLASTY (Left) PT Use Sling as directed Plan to D/c home with family F/u with Dr. Veverly Fells in office Follow instructions Take medications as directed Cautioned about poor weather conditions and Safety. Gaelan Glennon L 01/24/2015, 11:01 AM

## 2015-01-25 ENCOUNTER — Encounter (HOSPITAL_COMMUNITY): Payer: Self-pay | Admitting: Orthopedic Surgery

## 2015-01-26 NOTE — Discharge Summary (Signed)
Physician Discharge Summary  Patient ID: Anna Hodges MRN: [id number] DOB/AGE: Jun 17, 1950 65 y.o.  Admit date: 01/22/2015 Discharge date: 01/26/2015  Admission Diagnoses: Shoulder OA  Discharge Diagnoses:  Active Problems:   S/P shoulder replacement   Discharged Condition: good  Hospital Course:  Anna Hodges is a 65 y.o. who was admitted to [facility]. They were brought to the operating room on 01/22/2015 and underwent Procedure(s): LEFT TOTAL SHOULDER ARTHROPLASTY.  Patient tolerated the procedure well and was later transferred to the recovery room and then to the orthopaedic floor for postoperative care.  They were given PO and IV analgesics for pain control following their surgery.  They were given 24 hours of postoperative antibiotics of  Anti-infectives    Start     Dose/Rate Route Frequency Ordered Stop   01/22/15 2000  ceFAZolin (ANCEF) IVPB 2 g/50 mL premix     2 g 100 mL/hr over 30 Minutes Intravenous Every 6 hours 01/22/15 1909 01/23/15 0315   01/22/15 1600  ceFAZolin (ANCEF) IVPB 2 g/50 mL premix  Status:  Discontinued     2 g 100 mL/hr over 30 Minutes Intravenous Every 6 hours 01/22/15 1532 01/22/15 1909   01/22/15 0600  ceFAZolin (ANCEF) IVPB 2 g/50 mL premix     2 g 100 mL/hr over 30 Minutes Intravenous To ShortStay Surgical 01/21/15 1308 01/22/15 1010      PT and OT were ordered for total joint protocol.  Discharge planning consulted to help with postop disposition and equipment needs.  Patient had a goodnight on the evening of surgery and started to get up OOB with therapy on day one.  ontinued to work with therapy into day two.  Dressing was with normal limits.  The patient had progressed with therapy and meeting their goals. Patient was seen in rounds and was ready to go home.  Consults: n/a   Significant Diagnostic Studies: routine  Treatments: routine  Discharge Exam: Blood pressure 120/61, pulse 80, temperature 98.2 F (36.8 C), temperature  source Oral, resp. rate 18, height 5\' 4"  (1.626 m), weight 85.276 kg (188 lb), SpO2 94 %. Alert and oriented x3. RRR, Lungs clear, BS x4. Shoulder dressing C/D/I. No DVT signs. No signs of infection or compartment syndrome. LLE grossly neurovascularly intact.   Disposition: 01-Home or Self Care     Medication List    TAKE these medications        aspirin EC 81 MG tablet  Take 81 mg by mouth daily.     citalopram 40 MG tablet  Commonly known as:  CELEXA  Take 40 mg by mouth daily.     HYDROcodone-acetaminophen 5-325 MG tablet  Commonly known as:  NORCO/VICODIN  Take 1 tablet by mouth every 6 (six) hours as needed for moderate pain.     methocarbamol 500 MG tablet  Commonly known as:  ROBAXIN  Take 1 tablet (500 mg total) by mouth 3 (three) times daily as needed.     nabumetone 500 MG tablet  Commonly known as:  RELAFEN  Take 500 mg by mouth 2 (two) times daily.     omeprazole 20 MG capsule  Commonly known as:  PRILOSEC  Take 20 mg by mouth daily.     oxyCODONE-acetaminophen 5-325 MG tablet  Commonly known as:  ROXICET  Take 1-2 tablets by mouth every 4 (four) hours as needed for severe pain.     traMADol 50 MG tablet  Commonly known as:  ULTRAM  Take 50 mg  by mouth every 6 (six) hours as needed for moderate pain.           Follow-up Information    Follow up with NORRIS,STEVEN R, MD. Call in 2 weeks.   Specialty:  Orthopedic Surgery   Why:  [phone]   Contact information:   [address] [place] [address] [id number]       Signed: Lajean Manes 01/26/2015, 1:03 PM

## 2015-01-27 NOTE — Addendum Note (Signed)
Addendum  created 01/27/15 1256 by Roderic Palau, MD   Modules edited: Anesthesia Blocks and Procedures, Clinical Notes   Clinical Notes:  File: [id number]

## 2015-05-07 ENCOUNTER — Encounter: Payer: Self-pay | Admitting: Internal Medicine

## 2015-05-07 ENCOUNTER — Ambulatory Visit (INDEPENDENT_AMBULATORY_CARE_PROVIDER_SITE_OTHER): Payer: BLUE CROSS/BLUE SHIELD | Admitting: Internal Medicine

## 2015-05-07 ENCOUNTER — Encounter: Payer: Self-pay | Admitting: *Deleted

## 2015-05-07 VITALS — BP 118/76 | HR 85 | Ht 63.0 in | Wt 189.0 lb

## 2015-05-07 DIAGNOSIS — R05 Cough: Secondary | ICD-10-CM | POA: Diagnosis not present

## 2015-05-07 DIAGNOSIS — R058 Other specified cough: Secondary | ICD-10-CM | POA: Insufficient documentation

## 2015-05-07 MED ORDER — PANTOPRAZOLE SODIUM 40 MG PO TBEC: 40.0000 mg | DELAYED_RELEASE_TABLET | Freq: Every day | ORAL | Status: DC

## 2015-05-07 MED ORDER — TRAMADOL HCL 50 MG PO TABS: 50.0000 mg | ORAL_TABLET | Freq: Four times a day (QID) | ORAL | Status: DC | PRN

## 2015-05-07 MED ORDER — METHYLPREDNISOLONE ACETATE 80 MG/ML IJ SUSP
120.0000 mg | Freq: Once | INTRAMUSCULAR | Status: AC
  Administered 2015-05-07: 120 mg via INTRAMUSCULAR

## 2015-05-07 NOTE — Patient Instructions (Addendum)
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 Key is to eliminate all cough and reflux for at least 72 hours to let your airway heal and get in front of the cough instead of the cough being in front of you   Pantoprazole (protonix) 40 mg   Take  30-60 min before first meal of the day and Pepcid (famotidine)  20 mg one @  bedtime until return to office - this is the best way to tell whether stomach acid is contributing to your problem.    GERD (REFLUX)  is an extremely common cause of respiratory symptoms just like yours , many times with no obvious heartburn at all.    It can be treated with medication, but also with lifestyle changes including elevation of the head of your bed (ideally with 6 inch  bed blocks),  Smoking cessation, avoidance of late meals, excessive alcohol, and avoid fatty foods, chocolate, peppermint, colas, red wine, and acidic juices such as orange juice.  NO MINT OR MENTHOL PRODUCTS SO NO COUGH DROPS  USE SUGARLESS CANDY INSTEAD (Jolley ranchers or Stover's or Life Savers) or even ice chips will also do - the key is to swallow to prevent all throat clearing. NO OIL BASED VITAMINS - use powdered substitutes.  Take  PhenerganDM around the clock  and supplement if needed with  tramadol 50 mg up to 1-2 every 4 hours to suppress the urge to cough. Swallowing water or using ice chips/non mint and menthol containing candies (such as lifesavers or sugarless jolly ranchers) are also effective.  You should rest your voice and avoid activities that you know make you cough.  Once you have eliminated the cough for 3 straight days try reducing the tramadol first,  then the phenergan dm   If not better in one week return to office

## 2015-05-07 NOTE — Progress Notes (Signed)
Subjective:     Patient ID: Anna Hodges, female   DOB: [birth year], 65 y.o.   MRN: [id number]  HPI  52 yowf quit smoking 2003 with tendency to bad coughing since around 1995 some better since quit smoking with no seasonal pattern and no need for chronic rx but recurrent cough ing since 2009   November 16, 2009 1st pulmonary office eval cc sev years doe with ex assoc with persistent cough productive of minimal white mucus rx with saba since Oct 22 2009 > much better. Used spiriva x one box no benefit. sob with > adl's even when not coughing. cough day > night and no early am exac pattern. assoc with gen (not typically pleuritic) CP and hb with mild dysphagia. rec Take delsym two tsp every 12 hours and add tramadol 50 mg up to every 4 hours to suppress the urge to cough. Swallowing water or using ice chips/non mint and menthol containing candies (such as lifesavers or sugarless jolly ranchers) are also effective.  Dulera 100 2 puffs first thing in am and 2 puffs again in pm about 12 hours later  Prilosec Take one 30-60 min before first meal of the day and pepcid at bedtime  W/u Cough/ sob x 2009  - CT chest neg 10/22/09  - HFA 90% p coaching November 29, 2009   - PFT's 12/27/09 FEV1 2.10 (90%) ratio 80 and DLC0 66%> rec taper off Dulera    05/07/2015 1st [place] [facility] office visit/[facility]/  Lenny Bouchillon  On prilosec 20 mg with bfast   Chief Complaint  Patient presents with  . Pulmonary Consult    Self referral. Pt c/o cough and SOB since 12/18/14. Cough is occ prod with min clear to pale yellow sputum.  She sometimes coughs until she vomits. Cough can be triggered by talking or laughing. She states that she is SOB "most of the time"- can be with or without any exertion. She was winded walking from lobby to exam room today.   cough 24/7 since acute onset  Dec 2016 first symptom  was bad sore throat hoarseness then cough followed p 2 weeks and persistent daily since then  some better while sleeping / only thing that helps is cough syrup/ pred 50 x 5 days no better/ maint on ppi with bfast daily - sob even if not coughing, sometimes at rest.  No obvious day to day or daytime variability or assoc  cp or chest tightness, subjective wheeze or overt sinus or hb symptoms. No unusual exp hx or h/o childhood pna/ asthma or knowledge of premature birth.    Also denies any obvious fluctuation of symptoms with weather or environmental changes or other aggravating or alleviating factors except as outlined above   Current Medications, Allergies, Complete Past Medical History, Past Surgical History, Family History, and Social History were reviewed in [employer] record.  ROS  The following are not active complaints unless bolded sore throat, dysphagia, dental problems, itching, sneezing,  nasal congestion or excess/ purulent secretions, ear ache,   fever, chills, sweats, unintended wt loss, classically pleuritic or exertional cp, hemoptysis,  orthopnea pnd or leg swelling, presyncope, palpitations, abdominal pain, anorexia, nausea, vomiting, diarrhea  or change in bowel or bladder habits, change in stools or urine, dysuria,hematuria,  rash, arthralgias, visual complaints, headache, numbness, weakness or ataxia or problems with walking or coordination,  change in mood/affect or memory.  Review of Systems     Objective:   Physical Exam   amb extremely hoarse obese  wf  Classic pseudowheeze    Additional Exam: wt 183 November 17, 2009 > 181 November 29, 2009 > 184 December 28, 2009 > 05/07/2015  189  HEENT: nl dentition, turbinates, and oropharynx. Nl external ear canals without cough reflex NECK : without JVD/Nodes/TM/ nl carotid upstrokes bilaterally LUNGS: no acc muscle use, clear to A and P bilaterally without cough on insp or exp maneuvers CV: RRR no s3 or murmur or increase in P2, no edema  ABD: soft and nontender with nl  excursion in the supine position. No bruits or organomegaly, bowel sounds nl MS: warm without deformities, calf tenderness, cyanosis or clubbing   I personally reviewed images and agree with radiology impression as follows:  CXR:  04/24/15  wnl        Assessment:

## 2015-05-07 NOTE — Assessment & Plan Note (Signed)
The most common causes of chronic cough in immunocompetent adults include the following: upper airway cough syndrome (UACS), previously referred to as postnasal drip syndrome (PNDS), which is caused by variety of rhinosinus conditions; (2) asthma; (3) GERD; (4) chronic bronchitis from cigarette smoking or other inhaled environmental irritants; (5) nonasthmatic eosinophilic bronchitis; and (6) bronchiectasis.   These conditions, singly or in combination, have accounted for up to 94% of the causes of chronic cough in prospective studies.   Other conditions have constituted no >6% of the causes in prospective studies These have included bronchogenic carcinoma, chronic interstitial pneumonia, sarcoidosis, left ventricular failure, ACEI-induced cough, and aspiration from a condition associated with pharyngeal dysfunction.    Chronic cough is often simultaneously caused by more than one condition. A single cause has been found from 38 to 82% of the time, multiple causes from 18 to 62%. Multiply caused cough has been the result of three diseases up to 42% of the time.       Based on hx and exam, this is most likely:  Classic Upper airway cough syndrome, so named because it's frequently impossible to sort out how much is  CR/sinusitis with freq throat clearing (which can be related to primary GERD)   vs  causing  secondary (" extra esophageal")  GERD from wide swings in gastric pressure that occur with throat clearing, often  promoting self use of mint and menthol lozenges that reduce the lower esophageal sphincter tone and exacerbate the problem further in a cyclical fashion.   These are the same pts (now being labeled as having "irritable larynx syndrome" by some cough centers) who not infrequently have a history of having failed to tolerate ace inhibitors,  dry powder inhalers or biphosphonates or report having atypical reflux symptoms that don't respond to standard doses of PPI , and are easily confused as  having aecopd or asthma flares by even experienced allergists/ pulmonologists.   The first step is to maximize acid suppression/GERD rx  and eliminate cyclical coughing then regroup if the cough persists.  I had an extended discussion with the patient reviewing all relevant studies completed to date and  lasting 35 min/ 60 min visit  Explained the natural history of uri and why it's necessary in patients at risk to treat GERD aggressively - at least  short term -   to reduce risk of evolving cyclical cough initially  triggered by epithelial injury and a heightened sensitivty to the effects of any upper airway irritants,  most importantly acid - related - then perpetuated by epithelial injury related to the cough itself as the upper airway collapses on itself.  That is, the more sensitive the epithelium becomes once it is damaged by the virus, the more the ensuing irritability> the more the cough, the more the secondary reflux (especially in those prone to reflux) the more the irritation of the sensitive mucosa and so on in a  Classic cyclical pattern.  Key is for Anna Hodges  to get out in front of the cough to eliminate this cycle.  Please see instructions for details which were reviewed in writing and the patient given a copy highlighting the part that I personally wrote and discussed at today's ov.   See instructions for specific recommendations which were reviewed directly with the patient who was given a copy with highlighter outlining the key components.

## 2015-05-07 NOTE — Addendum Note (Signed)
Addended by: Rosana Berger on: 05/07/2015 05:18 PM   Modules accepted: Orders

## 2015-05-19 ENCOUNTER — Institutional Professional Consult (permissible substitution): Payer: BLUE CROSS/BLUE SHIELD | Admitting: Internal Medicine

## 2015-07-27 ENCOUNTER — Other Ambulatory Visit: Payer: Self-pay | Admitting: Internal Medicine

## 2015-09-04 ENCOUNTER — Other Ambulatory Visit: Payer: Self-pay | Admitting: Internal Medicine

## 2015-10-13 ENCOUNTER — Other Ambulatory Visit: Payer: Self-pay | Admitting: Internal Medicine

## 2015-11-02 ENCOUNTER — Other Ambulatory Visit: Payer: Self-pay | Admitting: Family Medicine

## 2015-11-02 DIAGNOSIS — Z1231 Encounter for screening mammogram for malignant neoplasm of breast: Secondary | ICD-10-CM

## 2015-11-02 DIAGNOSIS — Z9882 Breast implant status: Secondary | ICD-10-CM

## 2015-12-23 ENCOUNTER — Ambulatory Visit: Payer: BLUE CROSS/BLUE SHIELD

## 2016-01-28 ENCOUNTER — Ambulatory Visit: Payer: BLUE CROSS/BLUE SHIELD

## 2016-02-18 ENCOUNTER — Ambulatory Visit
Admission: RE | Admit: 2016-02-18 | Discharge: 2016-02-18 | Disposition: A | Discharge status: Home / Self Care | Payer: BLUE CROSS/BLUE SHIELD | Source: Ambulatory Visit | Attending: Family Medicine | Admitting: Family Medicine

## 2016-02-18 ENCOUNTER — Ambulatory Visit: Payer: BLUE CROSS/BLUE SHIELD

## 2016-02-18 DIAGNOSIS — Z9882 Breast implant status: Secondary | ICD-10-CM

## 2016-02-18 DIAGNOSIS — Z1231 Encounter for screening mammogram for malignant neoplasm of breast: Secondary | ICD-10-CM

## 2016-02-29 ENCOUNTER — Other Ambulatory Visit: Payer: Self-pay | Admitting: Internal Medicine

## 2016-02-29 MED ORDER — PANTOPRAZOLE SODIUM 40 MG PO TBEC: DELAYED_RELEASE_TABLET | ORAL | 0 refills | Status: DC

## 2016-05-28 ENCOUNTER — Other Ambulatory Visit: Payer: Self-pay | Admitting: Internal Medicine

## 2016-08-25 ENCOUNTER — Other Ambulatory Visit: Payer: Self-pay | Admitting: Internal Medicine

## 2016-10-17 ENCOUNTER — Encounter (HOSPITAL_COMMUNITY): Payer: Self-pay | Admitting: Emergency Medicine

## 2016-10-17 ENCOUNTER — Emergency Department (HOSPITAL_COMMUNITY)
Admission: EM | Admit: 2016-10-17 | Discharge: 2016-10-17 | Disposition: A | Discharge status: Home / Self Care | Payer: BLUE CROSS/BLUE SHIELD | Attending: Emergency Medicine | Admitting: Emergency Medicine

## 2016-10-17 ENCOUNTER — Emergency Department (HOSPITAL_COMMUNITY): Payer: BLUE CROSS/BLUE SHIELD

## 2016-10-17 DIAGNOSIS — Z87891 Personal history of nicotine dependence: Secondary | ICD-10-CM | POA: Insufficient documentation

## 2016-10-17 DIAGNOSIS — Z7982 Long term (current) use of aspirin: Secondary | ICD-10-CM | POA: Diagnosis not present

## 2016-10-17 DIAGNOSIS — K2960 Other gastritis without bleeding: Secondary | ICD-10-CM | POA: Diagnosis not present

## 2016-10-17 DIAGNOSIS — R0789 Other chest pain: Secondary | ICD-10-CM

## 2016-10-17 DIAGNOSIS — Z96612 Presence of left artificial shoulder joint: Secondary | ICD-10-CM | POA: Insufficient documentation

## 2016-10-17 DIAGNOSIS — Z79899 Other long term (current) drug therapy: Secondary | ICD-10-CM | POA: Diagnosis not present

## 2016-10-17 LAB — I-STAT TROPONIN, ED
Troponin i, poc: 0 ng/mL (ref 0.00–0.08)
Troponin i, poc: 0 ng/mL (ref 0.00–0.08)

## 2016-10-17 LAB — HEPATIC FUNCTION PANEL
ALT: 28 U/L (ref 14–54)
AST: 26 U/L (ref 15–41)
Albumin: 4 g/dL (ref 3.5–5.0)
Alkaline Phosphatase: 72 U/L (ref 38–126)
Bilirubin, Direct: 0.1 mg/dL (ref 0.1–0.5)
Indirect Bilirubin: 0.8 mg/dL (ref 0.3–0.9)
Total Bilirubin: 0.9 mg/dL (ref 0.3–1.2)
Total Protein: 7.1 g/dL (ref 6.5–8.1)

## 2016-10-17 LAB — CBC
HCT: 42.7 % (ref 36.0–46.0)
Hemoglobin: 14.4 g/dL (ref 12.0–15.0)
MCH: 28.9 pg (ref 26.0–34.0)
MCHC: 33.7 g/dL (ref 30.0–36.0)
MCV: 85.6 fL (ref 78.0–100.0)
Platelets: 212 10*3/uL (ref 150–400)
RBC: 4.99 MIL/uL (ref 3.87–5.11)
RDW: 12.2 % (ref 11.5–15.5)
WBC: 7 10*3/uL (ref 4.0–10.5)

## 2016-10-17 LAB — LIPASE, BLOOD: Lipase: 25 U/L (ref 11–51)

## 2016-10-17 LAB — URINALYSIS, MICROSCOPIC (REFLEX): RBC / HPF: NONE SEEN RBC/hpf (ref 0–5)

## 2016-10-17 LAB — BASIC METABOLIC PANEL
Anion gap: 9 (ref 5–15)
BUN: 12 mg/dL (ref 6–20)
CO2: 20 mmol/L — ABNORMAL LOW (ref 22–32)
Calcium: 9.1 mg/dL (ref 8.9–10.3)
Chloride: 107 mmol/L (ref 101–111)
Creatinine, Ser: 0.58 mg/dL (ref 0.44–1.00)
GFR calc Af Amer: >60 mL/min (ref >60)
GFR calc non Af Amer: >60 mL/min (ref >60)
Glucose, Bld: 113 mg/dL — ABNORMAL HIGH (ref 65–99)
Potassium: 3.9 mmol/L (ref 3.5–5.1)
Sodium: 136 mmol/L (ref 135–145)

## 2016-10-17 LAB — URINALYSIS, ROUTINE W REFLEX MICROSCOPIC
Bilirubin Urine: NEGATIVE
Glucose, UA: NEGATIVE mg/dL
Hgb urine dipstick: NEGATIVE
Ketones, ur: NEGATIVE mg/dL
Nitrite: NEGATIVE
Protein, ur: NEGATIVE mg/dL
Specific Gravity, Urine: 1.01 (ref 1.005–1.030)
pH: 6 (ref 5.0–8.0)

## 2016-10-17 MED ORDER — PANTOPRAZOLE SODIUM 40 MG IV SOLR
40.0000 mg | Freq: Once | INTRAVENOUS | Status: AC
  Administered 2016-10-17: 40 mg via INTRAVENOUS
  Filled 2016-10-17: qty 40

## 2016-10-17 MED ORDER — SODIUM CHLORIDE 0.9 % IV BOLUS (SEPSIS)
1000.0000 mL | Freq: Once | INTRAVENOUS | Status: AC
  Administered 2016-10-17: 1000 mL via INTRAVENOUS

## 2016-10-17 MED ORDER — PANTOPRAZOLE SODIUM 40 MG PO TBEC: 40.0000 mg | DELAYED_RELEASE_TABLET | Freq: Every day | ORAL | 0 refills | Status: DC

## 2016-10-17 MED ORDER — FAMOTIDINE IN NACL 20-0.9 MG/50ML-% IV SOLN
20.0000 mg | Freq: Once | INTRAVENOUS | Status: AC
  Administered 2016-10-17: 20 mg via INTRAVENOUS
  Filled 2016-10-17: qty 50

## 2016-10-17 MED ORDER — GI COCKTAIL ~~LOC~~
30.0000 mL | Freq: Once | ORAL | Status: AC
  Administered 2016-10-17: 30 mL via ORAL
  Filled 2016-10-17: qty 30

## 2016-10-17 NOTE — ED Triage Notes (Signed)
Pt states that since Saturday every time she eats she gets a sharp stabbing pain in the center of her chest that is non radiating. Pt states she also has a lot of ingestion when this pain comes, pt has been out of Protonix for 1 week.

## 2016-10-17 NOTE — Discharge Instructions (Signed)
Take protonix as prescribed.   See your doctor  Return to ER if you have worse chest pain, nausea, vomiting, abdominal pain

## 2016-10-17 NOTE — ED Notes (Signed)
Got patient undress on the monitor into a gown patient is resting with call bell in reach

## 2016-10-17 NOTE — ED Provider Notes (Signed)
[facility] DEPT Provider Note   CSN: [id number] Arrival date & time: 10/17/16  0932     History   Chief Complaint Chief Complaint  Patient presents with  . Chest Pain    HPI Anna Hodges is a 66 y.o. female history of reflux, PFO managed conservatively, here presenting with chest pain. Patient states that she is on protonix chronically and was not refilled about a week ago. For the last week, she noticed sharp chest pain about 30 minutes to an hour after she eats and usually lasts for hours. Denies any radiation to the pain and denies any shortness of breath. Denied any epigastric pain or abdominal pain or vomiting. Patient states that she has a history of patent foramen ovale that was small and was told that she doesn't need any surgical intervention. No hx of CAD or stents, no hx of blood clots.   The history is provided by the patient.    Past Medical History:  Diagnosis Date  . Arthritis    mainly in all her joints  . GERD (gastroesophageal reflux disease)   . PFO (patent foramen ovale)    01/05/10: transcranial doppler bubble study (Dr. Leonie Man): suggestive of small right to left intracardiac shunt. He felt unlikely to be of clinical significance. Rx ASA.    Patient Active Problem List   Diagnosis Date Noted  . Upper airway cough syndrome 05/07/2015  . 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 DYSPNEA 11/16/2009  . COUGH 11/16/2009    Past Surgical History:  Procedure Laterality Date  . ABDOMINAL HYSTERECTOMY    . BACK SURGERY     1 lumbar  . BREAST SURGERY     bilateral implants  . CERVICAL SPINE SURGERY    . TOTAL SHOULDER ARTHROPLASTY Left 01/22/2015   Procedure: LEFT TOTAL SHOULDER ARTHROPLASTY;  Surgeon: Netta Cedars, MD;  Location: [facility];  Service: Orthopedics;  Laterality: Left;    OB History    No data available       Home Medications    Prior to Admission medications   Medication Sig Start Date End Date Taking? Authorizing Provider  aspirin  EC 81 MG tablet Take 81 mg by mouth daily.    [provider]  citalopram (CELEXA) 40 MG tablet Take 40 mg by mouth daily.    [provider]  nabumetone (RELAFEN) 500 MG tablet Take 500 mg by mouth 2 (two) times daily. 12/02/14   [provider]  pantoprazole (PROTONIX) 40 MG tablet TAKE 1 TABLET BY MOUTH 30 TO 60 MINUTES BEFORE FIRST MEAL OF THE DAY 05/29/16   Tanda Rockers, MD  PROAIR HFA 108 [phone] Base) MCG/ACT inhaler Inhale 2 puffs into the lungs every other day as needed. 04/24/15   [provider]  promethazine-dextromethorphan (PROMETHAZINE-DM) 6.25-15 MG/5ML syrup As needed 04/29/15   [provider]  traMADol (ULTRAM) 50 MG tablet Take 1 tablet (50 mg total) by mouth every 6 (six) hours as needed (one every 4 hours as needed for cough). Reported on 05/07/2015 05/07/15   Tanda Rockers, MD    Family History No family history on file.  Social History Social History  Substance Use Topics  . Smoking status: Former Smoker    Packs/day: 0.25    Years: 6.00    Types: Cigarettes    Quit date: 01/16/1997  . Smokeless tobacco: Never Used  . Alcohol use No     Allergies   Codeine   Review of Systems Review of Systems  Cardiovascular: Positive for chest pain.  All other systems reviewed and are negative.    Physical Exam Updated Vital Signs BP 137/67   Pulse 71   Temp 97.9 F (36.6 C) (Oral)   Resp 17   Ht 5\' 3"  (1.6 m)   Wt 90.7 kg (200 lb)   SpO2 98%   BMI 35.43 kg/m   Physical Exam  Constitutional: She is oriented to person, place, and time. She appears well-developed.  HENT:  Head: Normocephalic.  Mouth/Throat: Oropharynx is clear and moist.  Eyes: Pupils are equal, round, and reactive to light. Conjunctivae and EOM are normal.  Neck: Normal range of motion. Neck supple.  Cardiovascular: Normal rate, regular rhythm and normal heart sounds.   Pulmonary/Chest: Effort normal and breath sounds normal. No respiratory  distress. She has no wheezes.  No reproducible tenderness   Abdominal: Soft. Bowel sounds are normal. She exhibits no distension. There is no tenderness. There is no guarding.  Musculoskeletal: Normal range of motion.  Neurological: She is alert and oriented to person, place, and time. She displays normal reflexes. No cranial nerve deficit. Coordination normal.  Skin: Skin is warm.  Psychiatric: She has a normal mood and affect.  Nursing note and vitals reviewed.    ED Treatments / Results  Labs (all labs ordered are listed, but only abnormal results are displayed) Labs Reviewed  BASIC METABOLIC PANEL - Abnormal; Notable for the following:       Result Value   CO2 20 (*)    Glucose, Bld 113 (*)    All other components within normal limits  URINALYSIS, ROUTINE W REFLEX MICROSCOPIC - Abnormal; Notable for the following:    Leukocytes, UA SMALL (*)    All other components within normal limits  URINALYSIS, MICROSCOPIC (REFLEX) - Abnormal; Notable for the following:    Bacteria, UA RARE (*)    Squamous Epithelial / LPF 0-5 (*)    All other components within normal limits  CBC  HEPATIC FUNCTION PANEL  LIPASE, BLOOD  I-STAT TROPONIN, ED  I-STAT TROPONIN, ED    EKG  EKG Interpretation  Date/Time:  Tuesday October 17 2016 08:37:18 EDT Ventricular Rate:  76 PR Interval:  146 QRS Duration: 78 QT Interval:  390 QTC Calculation: 438 R Axis:   35 Text Interpretation:  Normal sinus rhythm Cannot rule out Anterior infarct , age undetermined Abnormal ECG No significant change since last tracing Confirmed by Wandra Arthurs ([id number]) on 10/17/2016 9:13:51 AM       Radiology Dg Chest 2 View  Result Date: 10/17/2016 CLINICAL DATA:  Postprandial chest pain began 3 days ago. History of gastroesophageal reflux. EXAM: CHEST  2 VIEW COMPARISON:  Chest x-ray dated February 03, 2010 FINDINGS: The lungs are adequately inflated and clear. The heart and pulmonary vascularity are normal. There is no  pleural effusion. There is faint calcification in the wall of the aortic arch. There is multilevel degenerative disc disease of the thoracic spine. There is a prosthetic left shoulder joint. IMPRESSION: There is no acute cardiopulmonary abnormality. Electronically Signed   By: David  Martinique M.D.   On: 10/17/2016 09:15    Procedures Procedures (including critical care time)  Medications Ordered in ED Medications  sodium chloride 0.9 % bolus 1,000 mL (0 mLs Intravenous Stopped 10/17/16 1147)  pantoprazole (PROTONIX) injection 40 mg (40 mg Intravenous Given 10/17/16 1102)  famotidine (PEPCID) IVPB 20 mg premix (0 mg Intravenous Stopped 10/17/16 1145)  gi cocktail (Maalox,Lidocaine,Donnatal) (30 mLs Oral Given 10/17/16  1053)     Initial Impression / Assessment and Plan / ED Course  I have reviewed the triage vital signs and the nursing notes.  Pertinent labs & imaging results that were available during my care of the patient were reviewed by me and considered in my medical decision making (see chart for details).     CHERYLEE RAWLINSON is a 66 y.o. female here with chest pain. Chest pain for the last several days. Worse after eating and ran out of protonix. Likely reflux. No hx of CAD and I doubt PE or dissection. Will get labs, trop x 2, CXR.   12:57 PM Trop neg x 2. Labs and CXR unremarkable. Tolerated PO in the ED. Will refill protonix. Will have her follow up with PCP.   Final Clinical Impressions(s) / ED Diagnoses   Final diagnoses:  None    New Prescriptions New Prescriptions   No medications on file     Drenda Freeze, MD 10/17/16 1258

## 2016-10-30 ENCOUNTER — Encounter: Payer: Self-pay | Admitting: Internal Medicine

## 2016-10-30 ENCOUNTER — Ambulatory Visit (INDEPENDENT_AMBULATORY_CARE_PROVIDER_SITE_OTHER): Payer: BLUE CROSS/BLUE SHIELD | Admitting: Internal Medicine

## 2016-10-30 VITALS — BP 136/80 | HR 80 | Ht 63.0 in | Wt 202.4 lb

## 2016-10-30 DIAGNOSIS — R05 Cough: Secondary | ICD-10-CM | POA: Diagnosis not present

## 2016-10-30 DIAGNOSIS — R058 Other specified cough: Secondary | ICD-10-CM

## 2016-10-30 MED ORDER — PANTOPRAZOLE SODIUM 40 MG PO TBEC: 40.0000 mg | DELAYED_RELEASE_TABLET | Freq: Every day | ORAL | 2 refills | Status: DC

## 2016-10-30 NOTE — Progress Notes (Signed)
Subjective:    Patient ID: Anna Hodges, female   DOB: 1950/03/09, 66 y.o.   MRN: [id number]    Brief patient profile:   62 yowf quit smoking 2003 with tendency to bad coughing since around 1995 some better since quit smoking with no seasonal pattern and no need for chronic rx but recurrent cough ing since 2009     History of Present Illness  November 16, 2009 1st pulmonary office eval cc sev years doe with ex assoc with persistent cough productive of minimal white mucus rx with saba since Oct 22 2009 > much better. Used spiriva x one box no benefit. sob with > adl's even when not coughing. cough day > night and no early am exac pattern. assoc with gen (not typically pleuritic) CP and hb with mild dysphagia. rec Take delsym two tsp every 12 hours and add tramadol 50 mg up to every 4 hours to suppress the urge to cough. Swallowing water or using ice chips/non mint and menthol containing candies (such as lifesavers or sugarless jolly ranchers) are also effective.  Dulera 100 2 puffs first thing in am and 2 puffs again in pm about 12 hours later  Prilosec Take one 30-60 min before first meal of the day and pepcid at bedtime  W/u Cough/ sob x 2009  - CT chest neg 10/22/09  - HFA 90% p coaching November 29, 2009   - PFT's 12/27/09 FEV1 2.10 (90%) ratio 80 and DLC0 66%> rec taper off Dulera     05/07/2015 1st [facility] office visit/[facility]/  Anna Hodges  On prilosec 20 mg with bfast   Chief Complaint  Patient presents with  . Pulmonary Consult    Self referral. Pt c/o cough and SOB since 12/18/14. Cough is occ prod with min clear to pale yellow sputum.  She sometimes coughs until she vomits. Cough can be triggered by talking or laughing. She states that she is SOB "most of the time"- can be with or without any exertion. She was winded walking from lobby to exam room today.   cough 24/7 since acute onset  Dec 2016 first symptom  was bad sore throat hoarseness then  cough followed p 2 weeks and persistent daily since then some better while sleeping / only thing that helps is cough syrup/ pred 50 x 5 days no better/ maint on ppi with bfast daily - sob even if not coughing, sometimes at rest. rec  Pantoprazole (protonix) 40 mg   Take  30-60 min before first meal of the day and Pepcid (famotidine)  20 mg one @  bedtime until return to office - this is the best way to tell whether stomach acid is contributing to your problem.   GERD diet  Take  PhenerganDM around the clock  and supplement if needed with  tramadol 50 mg up to 1-2 every 4 hours   Once you have eliminated the cough for 3 straight days try reducing the tramadol first,  then the phenergan dm    10/30/2016  f/u ov/Anna Hodges re: uacs attributable to GERD  Chief Complaint  Patient presents with  . Follow-up    Pt states needing refills on protonix-went to ED on 10/17/16 with CP and was told she was having GERD. She states she has not been bothered by a cough and denies any co's today.   was maint on protonix then sev days p stopped PPI  And no otc's  started with cp p eating  > ER  10/17/16 neg w/u for MI  and cp resolved back on ppi q am.  No assoc cough or sob and no dysphagia/ has never seen gi re gerd/ maint also on relafen for psoriatic arthritis   No obvious day to day or daytime variability or assoc excess/ purulent sputum or mucus plugs or hemoptysis or cp or chest tightness, subjective wheeze or overt sinus or hb symptoms. No unusual exp hx or h/o childhood pna/ asthma or knowledge of premature birth.  Sleeping ok flat without nocturnal  or early am exacerbation  of respiratory  c/o's or need for noct saba. Also denies any obvious fluctuation of symptoms with weather or environmental changes or other aggravating or alleviating factors except as outlined above   Current Allergies, Complete Past Medical History, Past Surgical History, Family History, and Social History were reviewed in [facility] record.  ROS  The following are not active complaints unless bolded Hoarseness, sore throat, dysphagia, dental problems, itching, sneezing,  nasal congestion or discharge of excess mucus or purulent secretions, ear ache,   fever, chills, sweats, unintended wt loss or wt gain, classically pleuritic or exertional cp,  orthopnea pnd or leg swelling, presyncope, palpitations, abdominal pain, anorexia, nausea, vomiting, diarrhea  or change in bowel habits or change in bladder habits, change in stools or change in urine, dysuria, hematuria,  rash, arthralgias, visual complaints, headache, numbness, weakness or ataxia or problems with walking or coordination,  change in mood/affect or memory.        Current Meds  Medication Sig  . aspirin EC 81 MG tablet Take 81 mg by mouth daily.  . citalopram (CELEXA) 40 MG tablet Take 40 mg by mouth daily.  . nabumetone (RELAFEN) 500 MG tablet Take 500 mg by mouth 2 (two) times daily.  . pantoprazole (PROTONIX) 40 MG tablet Take 1 tablet (40 mg total) by mouth daily.  Marland Kitchen PROAIR HFA 108 (90 Base) MCG/ACT inhaler Inhale 2 puffs into the lungs every other day as needed.  . [Protonix 40 mg  .Take 30-60 min before first meal of the day                  Objective:   Physical Exam   amb nad/ all smiles    Additional Exam: wt 183 November 17, 2009 > 181 November 29, 2009 > 184 December 28, 2009 > 05/07/2015  189 . 10/30/2016  202   HEENT: nl dentition, turbinates, and oropharynx. Nl external ear canals without cough reflex NECK : without JVD/Nodes/TM/ nl carotid upstrokes bilaterally LUNGS: no acc muscle use, clear to A and P bilaterally without cough on insp or exp maneuvers CV: RRR no s3 or murmur or increase in P2, no edema  ABD: soft and nontender with nl excursion in the supine position. No bruits or organomegaly, bowel sounds nl MS: warm without deformities, calf tenderness, cyanosis or clubbing      I personally reviewed  images and agree with radiology impression as follows:  CXR:    10/17/16 There is no acute cardiopulmonary abnormality.    Assessment:

## 2016-10-30 NOTE — Assessment & Plan Note (Addendum)
Cyclical cough rx 1/74/9449 >>> resolved on GERD rx   Note cp flared off gerd rx but not cough > f/u GI needed  (see separate a/p)   I had an extended final summary discussion with the patient reviewing all relevant studies completed to date and  lasting 10    15 minute visit on the following issues:   Clearly gerd is related to cough but I am more concerned re longterm issues with es function and complications such a barrett's as well as risk/benefit of longterm ppi so I rec 3 more months ppi to give her plenty of time to see Dr Watt Climes   Each maintenance medication was reviewed in detail including most importantly the difference between maintenance and as needed and under what circumstances the prns are to be used.  Please see AVS for specific  Instructions which are unique to this visit and I personally typed out  which were reviewed in detail in writing with the patient and a copy provided.    Pulmonary f/u is prn

## 2016-10-30 NOTE — Patient Instructions (Addendum)
Continue protonix 40 mg Take 30-60 min before first meal of the day    Before this office runs out plan on seeing Dr Watt Climes for follow up     To get the most out of exercise, you need to be continuously aware that you are short of breath, but never out of breath, for 30 minutes daily. As you improve, it will actually be easier for you to do the same amount of exercise  in  30 minutes so always push to the level where you are short of breath.     If you are satisfied with your treatment plan,  let your doctor know and he/she can either refill your medications or you can return here when your prescription runs out.     If in any way you are not 100% satisfied,  please tell us.  If 100% better, tell your friends!  Pulmonary follow up is as needed

## 2017-02-10 ENCOUNTER — Other Ambulatory Visit: Payer: Self-pay | Admitting: Internal Medicine

## 2017-03-13 ENCOUNTER — Other Ambulatory Visit: Payer: Self-pay | Admitting: Internal Medicine

## 2017-04-10 ENCOUNTER — Other Ambulatory Visit: Payer: Self-pay | Admitting: Internal Medicine

## 2017-11-02 ENCOUNTER — Ambulatory Visit (INDEPENDENT_AMBULATORY_CARE_PROVIDER_SITE_OTHER): Payer: 59

## 2017-11-02 ENCOUNTER — Encounter: Payer: Self-pay | Admitting: Podiatry

## 2017-11-02 ENCOUNTER — Ambulatory Visit: Payer: 59 | Admitting: Podiatry

## 2017-11-02 ENCOUNTER — Other Ambulatory Visit: Payer: Self-pay | Admitting: Podiatry

## 2017-11-02 VITALS — BP 140/77 | HR 70 | Resp 16

## 2017-11-02 DIAGNOSIS — M722 Plantar fascial fibromatosis: Secondary | ICD-10-CM

## 2017-11-02 DIAGNOSIS — M79671 Pain in right foot: Secondary | ICD-10-CM

## 2017-11-02 MED ORDER — TRIAMCINOLONE ACETONIDE 10 MG/ML IJ SUSP
10.0000 mg | Freq: Once | INTRAMUSCULAR | Status: AC
  Administered 2017-11-02: 10 mg

## 2017-11-02 NOTE — Progress Notes (Signed)
Subjective:   Patient ID: Anna Hodges, female   DOB: 67 y.o.   MRN: [id number]   HPI Patient presents stating my right heel has really been bothering me and it is making it worse and I had surgery on my left one years ago that did very well patient does not smoke and likes to be active   Review of Systems  All other systems reviewed and are negative.       Objective:  Physical Exam  Constitutional: She appears well-developed and well-nourished.  Cardiovascular: Intact distal pulses.  Pulmonary/Chest: Effort normal.  Musculoskeletal: Normal range of motion.  Neurological: She is alert.  Skin: Skin is warm.  Nursing note and vitals reviewed.   Neurovascular status intact muscle strength is adequate range of motion within normal limits with patient found to have exquisite discomfort plantar aspect right heel at the insertional point of the tendon calcaneus with inflammation fluid buildup noted.  Patient's left heel has done well from endoscopic surgery done a number of years ago and patient was found to have good digital perfusion and is well oriented x3     Assessment:  Acute plantar fasciitis right heel at the insertional point of the tendon into the calcaneus with mild discomfort left     Plan:  H&P condition reviewed and went ahead today and injected the plantar fascial right 3 mg Kenalog 500 Xylocaine applied fascial brace discussed possible surgery and patient is interested in this but needs to wait till December.  I encouraged her to wear supportive type shoes and will see her back again in the next 4 weeks  X-ray indicates significant spur formation right over left heel with moderate depression of the arch

## 2017-11-02 NOTE — Progress Notes (Signed)
   Subjective:    Patient ID: Anna Hodges, female    DOB: 1950/03/15, 67 y.o.   MRN: [id number]  HPI    Review of Systems  All other systems reviewed and are negative.      Objective:   Physical Exam        Assessment & Plan:

## 2017-11-02 NOTE — Patient Instructions (Signed)

## 2017-11-23 ENCOUNTER — Encounter: Payer: Self-pay | Admitting: Podiatry

## 2017-11-23 ENCOUNTER — Ambulatory Visit: Payer: 59 | Admitting: Podiatry

## 2017-11-23 DIAGNOSIS — M722 Plantar fascial fibromatosis: Secondary | ICD-10-CM

## 2017-11-23 MED ORDER — TRIAMCINOLONE ACETONIDE 10 MG/ML IJ SUSP
10.0000 mg | Freq: Once | INTRAMUSCULAR | Status: AC
  Administered 2017-11-23: 10 mg

## 2017-11-26 NOTE — Progress Notes (Signed)
Subjective:   Patient ID: Anna Hodges, female   DOB: 67 y.o.   MRN: [id number]   HPI Patient states she seems to be improved but still is having a deep pain in her right heel.  She had had surgery on the left and knows it is very possible on the right but were continuing to try to treat this conservatively   ROS      Objective:  Physical Exam  Neurovascular status intact with patient's right heel showing inflammation in the medial band at the insertional point to the tendon calcaneus     Assessment:  Acute plantar fasciitis right improved but still present to deep palpation     Plan:  Sterile prep and injected the plantar fascia 3 mg Kenalog 5 mg Xylocaine and advised on reduced activity supportive shoes and will see back beginning of December and if symptoms have reoccurred significantly may end up requiring endoscopic surgery as we had done on the left

## 2017-12-21 ENCOUNTER — Ambulatory Visit: Payer: 59 | Admitting: Podiatry

## 2017-12-24 DIAGNOSIS — L4052 Psoriatic arthritis mutilans: Secondary | ICD-10-CM | POA: Diagnosis not present

## 2017-12-24 DIAGNOSIS — F419 Anxiety disorder, unspecified: Secondary | ICD-10-CM | POA: Diagnosis not present

## 2017-12-24 DIAGNOSIS — R030 Elevated blood-pressure reading, without diagnosis of hypertension: Secondary | ICD-10-CM | POA: Diagnosis not present

## 2017-12-24 DIAGNOSIS — Z0000 Encounter for general adult medical examination without abnormal findings: Secondary | ICD-10-CM | POA: Diagnosis not present

## 2017-12-24 DIAGNOSIS — Z1389 Encounter for screening for other disorder: Secondary | ICD-10-CM | POA: Diagnosis not present

## 2017-12-24 DIAGNOSIS — E7800 Pure hypercholesterolemia, unspecified: Secondary | ICD-10-CM | POA: Diagnosis not present

## 2017-12-24 DIAGNOSIS — Z23 Encounter for immunization: Secondary | ICD-10-CM | POA: Diagnosis not present

## 2017-12-24 DIAGNOSIS — Z6836 Body mass index (BMI) 36.0-36.9, adult: Secondary | ICD-10-CM | POA: Diagnosis not present

## 2017-12-24 DIAGNOSIS — E2839 Other primary ovarian failure: Secondary | ICD-10-CM | POA: Diagnosis not present

## 2017-12-24 DIAGNOSIS — Q211 Atrial septal defect: Secondary | ICD-10-CM | POA: Diagnosis not present

## 2017-12-24 DIAGNOSIS — K219 Gastro-esophageal reflux disease without esophagitis: Secondary | ICD-10-CM | POA: Diagnosis not present

## 2017-12-25 ENCOUNTER — Other Ambulatory Visit: Payer: Self-pay | Admitting: Family Medicine

## 2017-12-25 DIAGNOSIS — E2839 Other primary ovarian failure: Secondary | ICD-10-CM

## 2017-12-28 ENCOUNTER — Ambulatory Visit: Payer: 59 | Admitting: Podiatry

## 2018-01-04 ENCOUNTER — Other Ambulatory Visit: Payer: Self-pay | Admitting: Family Medicine

## 2018-01-04 DIAGNOSIS — Z1231 Encounter for screening mammogram for malignant neoplasm of breast: Secondary | ICD-10-CM

## 2018-02-15 DIAGNOSIS — L821 Other seborrheic keratosis: Secondary | ICD-10-CM | POA: Diagnosis not present

## 2018-02-15 DIAGNOSIS — L438 Other lichen planus: Secondary | ICD-10-CM | POA: Diagnosis not present

## 2018-02-24 DIAGNOSIS — J018 Other acute sinusitis: Secondary | ICD-10-CM | POA: Diagnosis not present

## 2018-02-24 DIAGNOSIS — G4483 Primary cough headache: Secondary | ICD-10-CM | POA: Diagnosis not present

## 2018-03-01 ENCOUNTER — Ambulatory Visit
Admission: RE | Admit: 2018-03-01 | Discharge: 2018-03-01 | Disposition: A | Discharge status: Home / Self Care | Payer: Medicare Other | Source: Ambulatory Visit | Attending: Family Medicine | Admitting: Family Medicine

## 2018-03-01 DIAGNOSIS — Z1231 Encounter for screening mammogram for malignant neoplasm of breast: Secondary | ICD-10-CM

## 2018-03-01 DIAGNOSIS — E2839 Other primary ovarian failure: Secondary | ICD-10-CM

## 2018-03-01 DIAGNOSIS — M85851 Other specified disorders of bone density and structure, right thigh: Secondary | ICD-10-CM | POA: Diagnosis not present

## 2018-03-01 DIAGNOSIS — Z780 Asymptomatic menopausal state: Secondary | ICD-10-CM | POA: Diagnosis not present

## 2018-03-19 DIAGNOSIS — E7800 Pure hypercholesterolemia, unspecified: Secondary | ICD-10-CM | POA: Diagnosis not present

## 2018-03-27 ENCOUNTER — Encounter: Payer: Self-pay | Admitting: Podiatry

## 2018-03-27 ENCOUNTER — Ambulatory Visit (INDEPENDENT_AMBULATORY_CARE_PROVIDER_SITE_OTHER): Payer: Medicare Other

## 2018-03-27 ENCOUNTER — Ambulatory Visit (INDEPENDENT_AMBULATORY_CARE_PROVIDER_SITE_OTHER): Payer: Medicare Other | Admitting: Podiatry

## 2018-03-27 ENCOUNTER — Other Ambulatory Visit: Payer: Self-pay

## 2018-03-27 DIAGNOSIS — M722 Plantar fascial fibromatosis: Secondary | ICD-10-CM

## 2018-03-27 NOTE — Progress Notes (Signed)
Subjective:   Patient ID: Anna Hodges, female   DOB: 68 y.o.   MRN: [id number]   HPI Patient states she has to get her right heel fixed as it is been killing her and it simply not responded and the other one did not eat with her and needed surgery   ROS      Objective:  Physical Exam  Neurovascular status intact muscle strength is adequate patient found to have exquisite discomfort plantar aspect right heel at the insertional point of the tendon into the calcaneus.     Assessment:  Severe plantar fasciitis right with inflammation fluid of the medial band     Plan:  H&P reviewed condition and recommended endoscopic release of the fascia.  I discussed the procedure and risk and patient wants surgery understanding risk and today allowed her to go over consent form reviewing alternative treatments complications with this procedure.  Patient is comfortable with this and at this point I went ahead and I had her sign consent form understanding the total recovery will take 3 to 6 months and there is no long-term guarantees and she wants procedure and signed consent form.  Patient is scheduled for outpatient surgery at air fracture walker dispensed today which she may begin to use prior to the procedure and was educated her on the usage of boot treatment.  Encouraged to call with any questions concerns prior to procedure

## 2018-03-27 NOTE — Patient Instructions (Signed)
Pre-Operative Instructions  Congratulations, you have decided to take an important step towards improving your quality of life.  You can be assured that the doctors and staff at [facility] will be with you every step of the way.  Here are some important things you should know:  1. Plan to be at the surgery center/hospital at least 1 (one) hour prior to your scheduled time, unless otherwise directed by the surgical center/hospital staff.  You must have a responsible adult accompany you, remain during the surgery and drive you home.  Make sure you have directions to the surgical center/hospital to ensure you arrive on time. 2. If you are having surgery at [facility] or [facility] hospitals, you will need a copy of your medical history and physical form from your family physician within one month prior to the date of surgery. We will give you a form for your primary physician to complete.  3. We make every effort to accommodate the date you request for surgery.  However, there are times where surgery dates or times have to be moved.  We will contact you as soon as possible if a change in schedule is required.   4. No aspirin/ibuprofen for one week before surgery.  If you are on aspirin, any non-steroidal anti-inflammatory medications (Mobic, Aleve, Ibuprofen) should not be taken seven (7) days prior to your surgery.  You make take Tylenol for pain prior to surgery.  5. Medications - If you are taking daily heart and blood pressure medications, seizure, reflux, allergy, asthma, anxiety, pain or diabetes medications, make sure you notify the surgery center/hospital before the day of surgery so they can tell you which medications you should take or avoid the day of surgery. 6. No food or drink after midnight the night before surgery unless directed otherwise by surgical center/hospital staff. 7. No alcoholic beverages 24-hours prior to surgery.  No smoking 24-hours prior or 24-hours after  surgery. 8. Wear loose pants or shorts. They should be loose enough to fit over bandages, boots, and casts. 9. Don't wear slip-on shoes. Sneakers are preferred. 10. Bring your boot with you to the surgery center/hospital.  Also bring crutches or a walker if your physician has prescribed it for you.  If you do not have this equipment, it will be provided for you after surgery. 11. If you have not been contacted by the surgery center/hospital by the day before your surgery, call to confirm the date and time of your surgery. 12. Leave-time from work may vary depending on the type of surgery you have.  Appropriate arrangements should be made prior to surgery with your employer. 13. Prescriptions will be provided immediately following surgery by your doctor.  Fill these as soon as possible after surgery and take the medication as directed. Pain medications will not be refilled on weekends and must be approved by the doctor. 14. Remove nail polish on the operative foot and avoid getting pedicures prior to surgery. 15. Wash the night before surgery.  The night before surgery wash the foot and leg well with water and the antibacterial soap provided. Be sure to pay special attention to beneath the toenails and in between the toes.  Wash for at least three (3) minutes. Rinse thoroughly with water and dry well with a towel.  Perform this wash unless told not to do so by your physician.  Enclosed: 1 Ice pack (please put in freezer the night before surgery)   1 Hibiclens skin cleaner     Pre-op instructions  If you have any questions regarding the instructions, please do not hesitate to call our office.  [place]: [address], [place], [place] [address] -- [phone]  [place]: [address] [place], [place] [address] -- [phone]  [place]: [address]  [place], [place] [address] -- [phone]  [place]: [address], [address], [place], [place] [address] -- [phone]  Website:  [email or web address] 

## 2018-04-16 ENCOUNTER — Telehealth: Payer: Self-pay | Admitting: *Deleted

## 2018-04-16 NOTE — Telephone Encounter (Addendum)
I am calling you in regards to your surgery that's scheduled for April 20, 2018.  Unfortunately, we need to reschedule it.  "I figured we would."  Can you do it on Jun 04, 2018.  "That date will be fine."  I'll get it rescheduled.  Please take care.  I rescheduled her surgery from April 23, 2018 to Jun 04, 2018 via the surgical center's [facility].

## 2018-05-31 ENCOUNTER — Telehealth: Payer: Self-pay | Admitting: *Deleted

## 2018-05-31 NOTE — Telephone Encounter (Signed)
"  I'm scheduled to have surgery on Tuesday.  I'm calling to see if I need to stop taking any medications."  You should get a call from the Pre-op nurse.  She'll let you know if you need to stop taking anything.  "I just spoke to her and she suggested I call you to find out.  I take a baby aspirin.  I wasn't sure if that would affect anything."  If Dr. Paulla Dolly didn't tell you to stop anything, you should be fine.

## 2018-06-04 ENCOUNTER — Encounter: Payer: Self-pay | Admitting: Podiatry

## 2018-06-04 DIAGNOSIS — K219 Gastro-esophageal reflux disease without esophagitis: Secondary | ICD-10-CM | POA: Diagnosis not present

## 2018-06-04 DIAGNOSIS — M722 Plantar fascial fibromatosis: Secondary | ICD-10-CM

## 2018-06-05 NOTE — Progress Notes (Signed)
Left voicemail at [phone] (Cell #) to check to see how they were doing from their surgery that they had done with Dr.Regal, yesterday, Tuesday, Jun 04, 2018.  DOS 06/04/2018 Endoscopic Release Med Band RT Heel  Waiting for a response.

## 2018-06-12 ENCOUNTER — Encounter: Payer: Self-pay | Admitting: Podiatry

## 2018-06-12 ENCOUNTER — Ambulatory Visit (INDEPENDENT_AMBULATORY_CARE_PROVIDER_SITE_OTHER): Payer: Medicare Other | Admitting: Podiatry

## 2018-06-12 ENCOUNTER — Other Ambulatory Visit: Payer: Self-pay

## 2018-06-12 VITALS — Temp 97.5°F

## 2018-06-12 DIAGNOSIS — Z09 Encounter for follow-up examination after completed treatment for conditions other than malignant neoplasm: Secondary | ICD-10-CM

## 2018-06-12 DIAGNOSIS — M722 Plantar fascial fibromatosis: Secondary | ICD-10-CM | POA: Diagnosis not present

## 2018-06-13 NOTE — Progress Notes (Signed)
Subjective:   Patient ID: Anna Hodges, female   DOB: 68 y.o.   MRN: [id number]   HPI Patient presents stating I am doing very well with minimal discomfort   ROS      Objective:  Physical Exam  Neurovascular status intact negative Homans sign noted with patient's right heel healing very well with stitches in place wound edges coapted well and no plantar pain noted currently     Assessment:  Doing well post endoscopic release right     Plan:  Reviewed the continuation of elevation immobilization compression and advised on returning 2 weeks for suture removal or earlier if needed and did dispense surgical shoe at the current time.  Reapplied sterile dress signed visit

## 2018-06-17 ENCOUNTER — Encounter: Payer: Medicare Other | Admitting: Podiatry

## 2018-06-24 ENCOUNTER — Ambulatory Visit (INDEPENDENT_AMBULATORY_CARE_PROVIDER_SITE_OTHER): Payer: Medicare Other | Admitting: Podiatry

## 2018-06-24 ENCOUNTER — Encounter: Payer: Self-pay | Admitting: Podiatry

## 2018-06-24 ENCOUNTER — Other Ambulatory Visit: Payer: Self-pay

## 2018-06-24 VITALS — Temp 97.9°F

## 2018-06-24 DIAGNOSIS — M722 Plantar fascial fibromatosis: Secondary | ICD-10-CM

## 2018-06-24 DIAGNOSIS — Z09 Encounter for follow-up examination after completed treatment for conditions other than malignant neoplasm: Secondary | ICD-10-CM

## 2018-06-24 NOTE — Progress Notes (Signed)
Subjective:   Patient ID: Anna Hodges, female   DOB: 68 y.o.   MRN: [id number]   HPI Patient states doing well and I am ready to try wearing regular shoe   ROS      Objective:  Physical Exam  Neurovascular status intact with patient's right foot improving with pain that has reduced over time and so far wound edges well coapted stitches intact     Assessment:  Doing well post endoscopic release     Plan:  Sutures removed sterile dressings applied advised on gradual increase in activity return to shoe and dispensed ankle compression stocking and will be seen back to recheck

## 2018-07-05 DIAGNOSIS — F419 Anxiety disorder, unspecified: Secondary | ICD-10-CM | POA: Diagnosis not present

## 2018-07-05 DIAGNOSIS — Z1210 Encounter for screening for malignant neoplasm of intestinal tract, unspecified: Secondary | ICD-10-CM | POA: Diagnosis not present

## 2018-07-05 DIAGNOSIS — M545 Low back pain: Secondary | ICD-10-CM | POA: Diagnosis not present

## 2018-07-05 DIAGNOSIS — M8588 Other specified disorders of bone density and structure, other site: Secondary | ICD-10-CM | POA: Diagnosis not present

## 2018-07-05 DIAGNOSIS — E7800 Pure hypercholesterolemia, unspecified: Secondary | ICD-10-CM | POA: Diagnosis not present

## 2018-07-05 DIAGNOSIS — K219 Gastro-esophageal reflux disease without esophagitis: Secondary | ICD-10-CM | POA: Diagnosis not present

## 2018-08-23 DIAGNOSIS — D123 Benign neoplasm of transverse colon: Secondary | ICD-10-CM | POA: Diagnosis not present

## 2018-08-23 DIAGNOSIS — Z1211 Encounter for screening for malignant neoplasm of colon: Secondary | ICD-10-CM | POA: Diagnosis not present

## 2018-08-26 DIAGNOSIS — M5030 Other cervical disc degeneration, unspecified cervical region: Secondary | ICD-10-CM | POA: Diagnosis not present

## 2018-08-26 DIAGNOSIS — F419 Anxiety disorder, unspecified: Secondary | ICD-10-CM | POA: Diagnosis not present

## 2018-08-26 DIAGNOSIS — R202 Paresthesia of skin: Secondary | ICD-10-CM | POA: Diagnosis not present

## 2018-08-27 DIAGNOSIS — D123 Benign neoplasm of transverse colon: Secondary | ICD-10-CM | POA: Diagnosis not present

## 2018-09-03 DIAGNOSIS — R29898 Other symptoms and signs involving the musculoskeletal system: Secondary | ICD-10-CM | POA: Diagnosis not present

## 2018-09-03 DIAGNOSIS — M542 Cervicalgia: Secondary | ICD-10-CM | POA: Diagnosis not present

## 2018-09-06 ENCOUNTER — Other Ambulatory Visit: Payer: Self-pay | Admitting: Family Medicine

## 2018-09-06 DIAGNOSIS — R29898 Other symptoms and signs involving the musculoskeletal system: Secondary | ICD-10-CM

## 2018-09-06 DIAGNOSIS — M542 Cervicalgia: Secondary | ICD-10-CM

## 2018-09-20 DIAGNOSIS — Z981 Arthrodesis status: Secondary | ICD-10-CM | POA: Diagnosis not present

## 2018-09-20 DIAGNOSIS — M5412 Radiculopathy, cervical region: Secondary | ICD-10-CM | POA: Diagnosis not present

## 2018-09-20 DIAGNOSIS — S129XXA Fracture of neck, unspecified, initial encounter: Secondary | ICD-10-CM | POA: Diagnosis not present

## 2018-09-20 DIAGNOSIS — M5030 Other cervical disc degeneration, unspecified cervical region: Secondary | ICD-10-CM | POA: Diagnosis not present

## 2018-09-20 DIAGNOSIS — M47812 Spondylosis without myelopathy or radiculopathy, cervical region: Secondary | ICD-10-CM | POA: Diagnosis not present

## 2018-09-20 DIAGNOSIS — M542 Cervicalgia: Secondary | ICD-10-CM | POA: Diagnosis not present

## 2018-09-24 ENCOUNTER — Other Ambulatory Visit (HOSPITAL_COMMUNITY): Payer: Self-pay | Admitting: Neurosurgery

## 2018-09-24 DIAGNOSIS — S129XXA Fracture of neck, unspecified, initial encounter: Secondary | ICD-10-CM

## 2018-09-25 DIAGNOSIS — M47812 Spondylosis without myelopathy or radiculopathy, cervical region: Secondary | ICD-10-CM | POA: Diagnosis not present

## 2018-09-25 DIAGNOSIS — M5412 Radiculopathy, cervical region: Secondary | ICD-10-CM | POA: Diagnosis not present

## 2018-09-25 DIAGNOSIS — M5023 Other cervical disc displacement, cervicothoracic region: Secondary | ICD-10-CM | POA: Diagnosis not present

## 2018-09-30 ENCOUNTER — Ambulatory Visit (HOSPITAL_COMMUNITY)
Admission: RE | Admit: 2018-09-30 | Discharge: 2018-09-30 | Disposition: A | Discharge status: Home / Self Care | Payer: Medicare Other | Source: Ambulatory Visit | Attending: Neurosurgery | Admitting: Neurosurgery

## 2018-09-30 ENCOUNTER — Other Ambulatory Visit: Payer: Self-pay

## 2018-09-30 DIAGNOSIS — M4802 Spinal stenosis, cervical region: Secondary | ICD-10-CM | POA: Diagnosis not present

## 2018-09-30 DIAGNOSIS — S129XXA Fracture of neck, unspecified, initial encounter: Secondary | ICD-10-CM

## 2018-09-30 DIAGNOSIS — M50322 Other cervical disc degeneration at C5-C6 level: Secondary | ICD-10-CM | POA: Diagnosis not present

## 2018-10-04 DIAGNOSIS — M5412 Radiculopathy, cervical region: Secondary | ICD-10-CM | POA: Diagnosis not present

## 2018-10-04 DIAGNOSIS — Z981 Arthrodesis status: Secondary | ICD-10-CM | POA: Diagnosis not present

## 2018-10-04 DIAGNOSIS — S129XXA Fracture of neck, unspecified, initial encounter: Secondary | ICD-10-CM | POA: Diagnosis not present

## 2018-10-04 DIAGNOSIS — M47812 Spondylosis without myelopathy or radiculopathy, cervical region: Secondary | ICD-10-CM | POA: Diagnosis not present

## 2018-10-04 DIAGNOSIS — M5030 Other cervical disc degeneration, unspecified cervical region: Secondary | ICD-10-CM | POA: Diagnosis not present

## 2018-10-04 DIAGNOSIS — M542 Cervicalgia: Secondary | ICD-10-CM | POA: Diagnosis not present

## 2019-02-21 ENCOUNTER — Ambulatory Visit: Payer: Medicare Other | Attending: Internal Medicine

## 2019-02-21 DIAGNOSIS — Z23 Encounter for immunization: Secondary | ICD-10-CM | POA: Insufficient documentation

## 2019-02-21 NOTE — Progress Notes (Signed)
   Covid-19 Vaccination Clinic  Name:  Anna Hodges [facility]    MRN: [id number] DOB: [birth year]  02/21/2019  [name] was observed post Covid-19 immunization for 15 minutes without incidence. She was provided with Vaccine Information Sheet and instruction to access the V-Safe system.   [name] was instructed to call 911 with any severe reactions post vaccine: Marland Kitchen Difficulty breathing  . Swelling of your face and throat  . A fast heartbeat  . A bad rash all over your body  . Dizziness and weakness    Immunizations Administered    Name Date Dose VIS Date Route   Pfizer COVID-19 Vaccine 02/21/2019 11:32 AM 0.3 mL 12/27/2018 Intramuscular   Manufacturer: [employer]   Lot: [id number]   [place]: [id number]

## 2019-03-18 ENCOUNTER — Ambulatory Visit: Payer: Medicare Other | Attending: Internal Medicine

## 2019-03-18 DIAGNOSIS — Z23 Encounter for immunization: Secondary | ICD-10-CM | POA: Insufficient documentation

## 2019-03-18 NOTE — Progress Notes (Signed)
   Covid-19 Vaccination Clinic  Name:  Anna Hodges [facility]    MRN: [id number] DOB: 1950/05/25  03/18/2019  Ms. Anna Hodges was observed post Covid-19 immunization for 15 minutes without incident. She was provided with Vaccine Information Sheet and instruction to access the V-Safe system.   Ms. Anna Hodges was instructed to call 911 with any severe reactions post vaccine: Marland Kitchen Difficulty breathing  . Swelling of face and throat  . A fast heartbeat  . A bad rash all over body  . Dizziness and weakness   Immunizations Administered    Name Date Dose VIS Date Route   Pfizer COVID-19 Vaccine 03/18/2019 11:33 AM 0.3 mL 12/27/2018 Intramuscular   Manufacturer: [place]   Lot: [id number]   [place]: [id number]

## 2019-04-28 DIAGNOSIS — L821 Other seborrheic keratosis: Secondary | ICD-10-CM | POA: Diagnosis not present

## 2019-04-28 DIAGNOSIS — D485 Neoplasm of uncertain behavior of skin: Secondary | ICD-10-CM | POA: Diagnosis not present

## 2019-04-28 DIAGNOSIS — L812 Freckles: Secondary | ICD-10-CM | POA: Diagnosis not present

## 2019-04-28 DIAGNOSIS — E7800 Pure hypercholesterolemia, unspecified: Secondary | ICD-10-CM | POA: Diagnosis not present

## 2019-04-28 DIAGNOSIS — Z23 Encounter for immunization: Secondary | ICD-10-CM | POA: Diagnosis not present

## 2019-04-28 DIAGNOSIS — C44722 Squamous cell carcinoma of skin of right lower limb, including hip: Secondary | ICD-10-CM | POA: Diagnosis not present

## 2019-08-19 DIAGNOSIS — F39 Unspecified mood [affective] disorder: Secondary | ICD-10-CM | POA: Diagnosis not present

## 2019-08-19 DIAGNOSIS — F419 Anxiety disorder, unspecified: Secondary | ICD-10-CM | POA: Diagnosis not present

## 2019-08-19 DIAGNOSIS — Z6836 Body mass index (BMI) 36.0-36.9, adult: Secondary | ICD-10-CM | POA: Diagnosis not present

## 2019-10-01 DIAGNOSIS — Z6836 Body mass index (BMI) 36.0-36.9, adult: Secondary | ICD-10-CM | POA: Diagnosis not present

## 2019-10-01 DIAGNOSIS — M5136 Other intervertebral disc degeneration, lumbar region: Secondary | ICD-10-CM | POA: Diagnosis not present

## 2019-10-01 DIAGNOSIS — R030 Elevated blood-pressure reading, without diagnosis of hypertension: Secondary | ICD-10-CM | POA: Diagnosis not present

## 2019-10-01 DIAGNOSIS — M5030 Other cervical disc degeneration, unspecified cervical region: Secondary | ICD-10-CM | POA: Diagnosis not present

## 2019-10-01 DIAGNOSIS — M542 Cervicalgia: Secondary | ICD-10-CM | POA: Diagnosis not present

## 2019-11-03 DIAGNOSIS — Z85828 Personal history of other malignant neoplasm of skin: Secondary | ICD-10-CM | POA: Diagnosis not present

## 2019-11-19 DIAGNOSIS — Z23 Encounter for immunization: Secondary | ICD-10-CM | POA: Diagnosis not present

## 2019-12-02 DIAGNOSIS — Z85828 Personal history of other malignant neoplasm of skin: Secondary | ICD-10-CM | POA: Diagnosis not present

## 2019-12-02 DIAGNOSIS — L821 Other seborrheic keratosis: Secondary | ICD-10-CM | POA: Diagnosis not present

## 2020-01-01 ENCOUNTER — Ambulatory Visit (INDEPENDENT_AMBULATORY_CARE_PROVIDER_SITE_OTHER): Payer: Medicare Other | Admitting: Podiatry

## 2020-01-01 ENCOUNTER — Encounter: Payer: Self-pay | Admitting: Podiatry

## 2020-01-01 ENCOUNTER — Ambulatory Visit (INDEPENDENT_AMBULATORY_CARE_PROVIDER_SITE_OTHER): Payer: Medicare Other

## 2020-01-01 ENCOUNTER — Other Ambulatory Visit: Payer: Self-pay

## 2020-01-01 DIAGNOSIS — M79671 Pain in right foot: Secondary | ICD-10-CM

## 2020-01-01 DIAGNOSIS — S93491A Sprain of other ligament of right ankle, initial encounter: Secondary | ICD-10-CM

## 2020-01-01 DIAGNOSIS — R600 Localized edema: Secondary | ICD-10-CM | POA: Diagnosis not present

## 2020-01-02 ENCOUNTER — Telehealth: Payer: Self-pay | Admitting: Podiatry

## 2020-01-02 NOTE — Telephone Encounter (Signed)
Pt states she's experiencing a lot of pain, and is requesting an Rx. Please advise.

## 2020-01-02 NOTE — Telephone Encounter (Signed)
I would recommend ibuprophen or alleve

## 2020-01-05 NOTE — Progress Notes (Signed)
Subjective:   Patient ID: Anna Hodges, female   DOB: 69 y.o.   MRN: [id number]   HPI Patient presents stating she fell and she hurt her right ankle and she is concerned she may have broken something.  States is been very sore and hard to walk on   ROS      Objective:  Physical Exam  Neurovascular status intact negative Bevelyn Buckles' sign noted with significant edema around the right ankle and right lateral foot with discomfort mostly around the lateral side of the foot.  There is quite a bit of bruising it is painful when palpated and difficult to ascertain range of motion due to splinting that she is experiencing due to pain     Assessment:  Significant trauma to the right ankle with inability to bear any weight on her foot     Plan:  H&P x-ray reviewed and discussed immobilization and today I went ahead and applied an Unna boot followed by Ace wrap and dispensed air fracture walker to completely immobilize.  Discussed aggressive ice elevation and patient will be seen back for [place] to recheck again in 3 weeks or earlier if needed  X-rays at this point would not suspicious for base of fifth metatarsal fracture in the fibula did look healthy currently

## 2020-01-10 ENCOUNTER — Emergency Department (HOSPITAL_COMMUNITY)
Admission: EM | Admit: 2020-01-10 | Discharge: 2020-01-10 | Disposition: A | Discharge status: Home / Self Care | Payer: Medicare Other | Attending: Emergency Medicine | Admitting: Emergency Medicine

## 2020-01-10 ENCOUNTER — Other Ambulatory Visit: Payer: Self-pay

## 2020-01-10 ENCOUNTER — Encounter (HOSPITAL_COMMUNITY): Payer: Self-pay

## 2020-01-10 ENCOUNTER — Emergency Department (HOSPITAL_COMMUNITY): Payer: Medicare Other

## 2020-01-10 DIAGNOSIS — W19XXXA Unspecified fall, initial encounter: Secondary | ICD-10-CM | POA: Diagnosis not present

## 2020-01-10 DIAGNOSIS — Z87891 Personal history of nicotine dependence: Secondary | ICD-10-CM | POA: Insufficient documentation

## 2020-01-10 DIAGNOSIS — Z7982 Long term (current) use of aspirin: Secondary | ICD-10-CM | POA: Insufficient documentation

## 2020-01-10 DIAGNOSIS — M47812 Spondylosis without myelopathy or radiculopathy, cervical region: Secondary | ICD-10-CM | POA: Diagnosis not present

## 2020-01-10 DIAGNOSIS — S161XXA Strain of muscle, fascia and tendon at neck level, initial encounter: Secondary | ICD-10-CM | POA: Diagnosis not present

## 2020-01-10 DIAGNOSIS — M4802 Spinal stenosis, cervical region: Secondary | ICD-10-CM | POA: Diagnosis not present

## 2020-01-10 DIAGNOSIS — Z96612 Presence of left artificial shoulder joint: Secondary | ICD-10-CM | POA: Insufficient documentation

## 2020-01-10 DIAGNOSIS — S199XXA Unspecified injury of neck, initial encounter: Secondary | ICD-10-CM | POA: Diagnosis not present

## 2020-01-10 DIAGNOSIS — M4803 Spinal stenosis, cervicothoracic region: Secondary | ICD-10-CM | POA: Diagnosis not present

## 2020-01-10 MED ORDER — LIDOCAINE 5 % EX PTCH: 1.0000 | MEDICATED_PATCH | CUTANEOUS | 0 refills | Status: DC

## 2020-01-10 MED ORDER — HYDROCODONE-ACETAMINOPHEN 5-325 MG PO TABS
1.0000 | ORAL_TABLET | Freq: Once | ORAL | Status: AC
  Administered 2020-01-10: 1 via ORAL
  Filled 2020-01-10: qty 1

## 2020-01-10 MED ORDER — DIAZEPAM 5 MG PO TABS
5.0000 mg | ORAL_TABLET | Freq: Once | ORAL | Status: AC
  Administered 2020-01-10: 5 mg via ORAL
  Filled 2020-01-10: qty 1

## 2020-01-10 NOTE — ED Provider Notes (Signed)
[facility] EMERGENCY DEPARTMENT Provider Note   CSN: [id number] Arrival date & time: 01/10/20  0539     History Chief Complaint  Patient presents with  . Neck Pain    Anna Hodges is a 69 y.o. female.  The history is provided by the patient and medical records.   Anna Hodges is a 69 y.o. female who presents to the Emergency Department complaining of neck pain.  She had a fall on 12/16 and twisted her ankle.  She had mild left sided neck pain that day but did not think she injured her neck at that time.  Pain has significantly worsened since that time.  For the last four days she cannot range her neck at all.  Pain is constant, worse with movement.  Pain is no longer laterallizing.  She purchased an OTC soft collar yesterday - minimal change in sxs.    Has a hx/o cervical spine surgery.    Denies fevers, chest pain, sob, N/V/D, abdominal pain, dysuria, incontinence, numbness, weakness.  Has chronic difficulty swallowing since second neck surgery (7 years ago).    Has been taking ibuprofen, flexeril, nabumetone.  No signiifcant improvement in sxs with these medications.      Past Medical History:  Diagnosis Date  . Arthritis    mainly in all her joints  . GERD (gastroesophageal reflux disease)   . PFO (patent foramen ovale)    01/05/10: transcranial doppler bubble study (Dr. Pearlean Brownie): suggestive of small right to left intracardiac shunt. He felt unlikely to be of clinical significance. Rx ASA.    Patient Active Problem List   Diagnosis Date Noted  . Upper airway cough syndrome 05/07/2015  . 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 DYSPNEA 11/16/2009  . COUGH 11/16/2009    Past Surgical History:  Procedure Laterality Date  . ABDOMINAL HYSTERECTOMY    . BACK SURGERY     1 lumbar  . BREAST SURGERY     bilateral implants  . CERVICAL SPINE SURGERY    . TOTAL SHOULDER ARTHROPLASTY Left 01/22/2015   Procedure: LEFT TOTAL SHOULDER  ARTHROPLASTY;  Surgeon: Beverely Low, MD;  Location[facility] OR;  Service: Orthopedics;  Laterality: Left;     OB History   No obstetric history on file.     No family history on file.  Social History   Tobacco Use  . Smoking status: Former Smoker    Packs/day: 0.25    Years: 6.00    Pack years: 1.50    Types: Cigarettes    Quit date: 01/16/1997    Years since quitting: 22.9  . Smokeless tobacco: Never Used  Substance Use Topics  . Alcohol use: No    Alcohol/week: 3.0 standard drinks    Types: 3 Glasses of wine per week  . Drug use: No    Home Medications Prior to Admission medications   Medication Sig Start Date End Date Taking? Authorizing Provider  aspirin EC 81 MG tablet Take 81 mg by mouth daily.    [provider]  citalopram (CELEXA) 40 MG tablet Take 40 mg by mouth daily.    [provider]  cyclobenzaprine (FLEXERIL) 10 MG tablet  10/01/19   [provider]  lamoTRIgine (LAMICTAL) 100 MG tablet  08/19/19   [provider]  lidocaine (LIDODERM) 5 % Place 1 patch onto the skin daily. Remove & Discard patch within 12 hours or as directed by MD 01/10/20   Tilden Fossa, MD  nabumetone (RELAFEN) 500  MG tablet Take 500 mg by mouth 2 (two) times daily. 12/02/14   [provider]  pantoprazole (PROTONIX) 40 MG tablet Take 1 tablet (40 mg total) by mouth daily. 10/17/16   Drenda Freeze, MD  pantoprazole (PROTONIX) 40 MG tablet Take 1 tablet (40 mg total) by mouth daily. Take 30-60 min before first meal of the day 10/30/16   Tanda Rockers, MD  pantoprazole (PROTONIX) 40 MG tablet TAKE 1 TABLET BY MOUTH EVERY DAY 30 TO 60 MINUTES BEFORE FIRST MEAL OF THE DAY 03/13/17   Tanda Rockers, MD  pravastatin (PRAVACHOL) 40 MG tablet  01/01/18   [provider]  PROAIR HFA 108 (90 Base) MCG/ACT inhaler Inhale 2 puffs into the lungs every other day as needed. 04/24/15   [provider]    Allergies    Codeine  Review of  Systems   Review of Systems  All other systems reviewed and are negative.   Physical Exam Updated Vital Signs BP (!) 156/71 (BP Location: Right Arm)   Pulse 92   Temp 98.7 F (37.1 C) (Oral)   Resp 16   SpO2 98%   Physical Exam Vitals and nursing note reviewed.  Constitutional:      Appearance: She is well-developed and well-nourished.  HENT:     Head: Normocephalic and atraumatic.  Neck:     Comments: Unable to range the neck. There is midline tenderness to palpation over the lower and mid cervical spine. Cardiovascular:     Rate and Rhythm: Normal rate and regular rhythm.     Heart sounds: No murmur heard.   Pulmonary:     Effort: Pulmonary effort is normal. No respiratory distress.     Breath sounds: Normal breath sounds.  Abdominal:     Palpations: Abdomen is soft.     Tenderness: There is no abdominal tenderness. There is no guarding or rebound.  Musculoskeletal:        General: No tenderness or edema.     Comments: Walking boot on the right foot.  Skin:    General: Skin is warm and dry.  Neurological:     Mental Status: She is alert and oriented to person, place, and time.     Comments: Five out of five strength in bilateral upper and lower extremities in proximal and distal muscle groups. Sensation light touch intact in all four extremities  Psychiatric:        Mood and Affect: Mood and affect normal.        Behavior: Behavior normal.     ED Results / Procedures / Treatments   Labs (all labs ordered are listed, but only abnormal results are displayed) Labs Reviewed - No data to display  EKG None  Radiology CT Cervical Spine Wo Contrast  Result Date: 01/10/2020 CLINICAL DATA:  Neck trauma. EXAM: CT CERVICAL SPINE WITHOUT CONTRAST TECHNIQUE: Multidetector CT imaging of the cervical spine was performed without intravenous contrast. Multiplanar CT image reconstructions were also generated. COMPARISON:  09/30/2018 FINDINGS: Alignment: No subluxation. Skull  base and vertebrae: Moderate spondylosis throughout the cervical spine. Mild stable chronic loss of vertebral body height of C6. Anterior fusion hardware intact and unchanged at the C3-4 level with associated interbody fusion at the C3-4 level. Atlantoaxial articulation is unremarkable. There is moderate uncovertebral joint spurring. Facet arthropathy is present. No acute fracture. Mild right-sided neural foraminal narrowing at the C5-6 level and mild left-sided neural from narrowing at the C6-7 level. Bilateral neural foraminal narrowing at  the C7-T1 level. Soft tissues and spinal canal: No prevertebral fluid or swelling. No visible canal hematoma. Disc levels: Moderate disc space narrowing at the C4-5, C5-6, C6-7 and C7-T1 levels. Upper chest: No acute findings. Other: None. IMPRESSION: 1. No acute findings. 2. 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 Anterior fusion hardware intact and unchanged at the C3-4 level with interbody fusion at the C3-4 level. Electronically Signed   By: Marin Olp M.D.   On: 01/10/2020 11:03    Procedures Procedures (including critical care time)  Medications Ordered in ED Medications  diazepam (VALIUM) tablet 5 mg (5 mg Oral Given 01/10/20 1004)  HYDROcodone-acetaminophen (NORCO/VICODIN) 5-325 MG per tablet 1 tablet (1 tablet Oral Given 01/10/20 1004)    ED Course  I have reviewed the triage vital signs and the nursing notes.  Pertinent labs & imaging results that were available during my care of the patient were reviewed by me and considered in my medical decision making (see chart for details).    MDM Rules/Calculators/A&P                         patient here for evaluation of progressive neck pain following a fall on December 16. She is neurologically intact on examination. She does have midline and muscular tenderness on examination. Her pain has  gradually progressed since the injury. Imaging with spondylosis, no acute fracture or acute abnormality. Feel clinically significant ligamentous injury is unlikely given time course of her symptoms. Doubt acute infectious process. Discussed with patient home care for cervical strain. Recommend that she continue her collar as long as her pain persists. Recommend neurosurgery follow-up as well as return precautions. Discussed continuing her current medications with addition of Lidoderm patch, acetaminophen OTC.  Final Clinical Impression(s) / ED Diagnoses Final diagnoses:  Acute strain of neck muscle, initial encounter    Rx / DC Orders ED Discharge Orders         Ordered    lidocaine (LIDODERM) 5 %  Every 24 hours        01/10/20 1118           Quintella Reichert, MD 01/10/20 1153

## 2020-01-10 NOTE — ED Triage Notes (Signed)
Pt reports neck pain since falling on 12/16, hx of cervical spine surgery 5-6 years ago. Soft collar in place from home. Pt a.o

## 2020-01-12 ENCOUNTER — Other Ambulatory Visit: Payer: Self-pay | Admitting: Podiatry

## 2020-01-12 DIAGNOSIS — S93491A Sprain of other ligament of right ankle, initial encounter: Secondary | ICD-10-CM

## 2020-01-15 ENCOUNTER — Other Ambulatory Visit: Payer: Self-pay

## 2020-01-15 ENCOUNTER — Ambulatory Visit (INDEPENDENT_AMBULATORY_CARE_PROVIDER_SITE_OTHER): Payer: Medicare Other | Admitting: Podiatry

## 2020-01-15 ENCOUNTER — Encounter: Payer: Self-pay | Admitting: Podiatry

## 2020-01-15 DIAGNOSIS — E7800 Pure hypercholesterolemia, unspecified: Secondary | ICD-10-CM | POA: Insufficient documentation

## 2020-01-15 DIAGNOSIS — K580 Irritable bowel syndrome with diarrhea: Secondary | ICD-10-CM | POA: Insufficient documentation

## 2020-01-15 DIAGNOSIS — M8588 Other specified disorders of bone density and structure, other site: Secondary | ICD-10-CM | POA: Insufficient documentation

## 2020-01-15 DIAGNOSIS — E2839 Other primary ovarian failure: Secondary | ICD-10-CM | POA: Insufficient documentation

## 2020-01-15 DIAGNOSIS — L820 Inflamed seborrheic keratosis: Secondary | ICD-10-CM | POA: Insufficient documentation

## 2020-01-15 DIAGNOSIS — S93491A Sprain of other ligament of right ankle, initial encounter: Secondary | ICD-10-CM | POA: Diagnosis not present

## 2020-01-15 DIAGNOSIS — M545 Low back pain, unspecified: Secondary | ICD-10-CM | POA: Insufficient documentation

## 2020-01-15 DIAGNOSIS — Z6836 Body mass index (BMI) 36.0-36.9, adult: Secondary | ICD-10-CM | POA: Insufficient documentation

## 2020-01-15 DIAGNOSIS — M4800 Spinal stenosis, site unspecified: Secondary | ICD-10-CM | POA: Insufficient documentation

## 2020-01-15 DIAGNOSIS — F39 Unspecified mood [affective] disorder: Secondary | ICD-10-CM | POA: Insufficient documentation

## 2020-01-15 DIAGNOSIS — Q211 Atrial septal defect, unspecified: Secondary | ICD-10-CM | POA: Insufficient documentation

## 2020-01-15 DIAGNOSIS — M25519 Pain in unspecified shoulder: Secondary | ICD-10-CM | POA: Insufficient documentation

## 2020-01-15 DIAGNOSIS — R030 Elevated blood-pressure reading, without diagnosis of hypertension: Secondary | ICD-10-CM | POA: Diagnosis not present

## 2020-01-15 DIAGNOSIS — L4052 Psoriatic arthritis mutilans: Secondary | ICD-10-CM | POA: Insufficient documentation

## 2020-01-15 DIAGNOSIS — K219 Gastro-esophageal reflux disease without esophagitis: Secondary | ICD-10-CM | POA: Insufficient documentation

## 2020-01-15 DIAGNOSIS — R600 Localized edema: Secondary | ICD-10-CM | POA: Diagnosis not present

## 2020-01-15 DIAGNOSIS — F419 Anxiety disorder, unspecified: Secondary | ICD-10-CM | POA: Insufficient documentation

## 2020-01-15 DIAGNOSIS — Z1210 Encounter for screening for malignant neoplasm of intestinal tract, unspecified: Secondary | ICD-10-CM | POA: Insufficient documentation

## 2020-01-15 DIAGNOSIS — L570 Actinic keratosis: Secondary | ICD-10-CM | POA: Insufficient documentation

## 2020-01-15 DIAGNOSIS — Z6834 Body mass index (BMI) 34.0-34.9, adult: Secondary | ICD-10-CM | POA: Diagnosis not present

## 2020-01-15 NOTE — Progress Notes (Signed)
Subjective:   Patient ID: Anna Hodges, female   DOB: 69 y.o.   MRN: [id number]   HPI Patient presents stating that her ankle is feeling better still having some swelling   ROS      Objective:  Physical Exam  Neurovascular status intact diminished edema around the midfoot ankle right with range of motion which is improved no indications of ligament instability with pain still present but significantly improved from previous visit     Assessment:  Doing much better after severe ankle sprain of 2 weeks origin     Plan:  H&P educated on condition and advised her at this point she does not need to continue with the boot over the next 1 to 2 weeks and I did dispense ankle compression stocking.  Patient will be seen back to recheck encouraged to call with questions concerns

## 2020-02-11 ENCOUNTER — Other Ambulatory Visit: Payer: Self-pay | Admitting: Family Medicine

## 2020-02-11 DIAGNOSIS — Z0000 Encounter for general adult medical examination without abnormal findings: Secondary | ICD-10-CM

## 2020-03-26 ENCOUNTER — Ambulatory Visit
Admission: RE | Admit: 2020-03-26 | Discharge: 2020-03-26 | Disposition: A | Discharge status: Home / Self Care | Payer: Medicare Other | Source: Ambulatory Visit | Attending: Family Medicine | Admitting: Family Medicine

## 2020-03-26 ENCOUNTER — Other Ambulatory Visit: Payer: Self-pay

## 2020-03-26 DIAGNOSIS — Z0000 Encounter for general adult medical examination without abnormal findings: Secondary | ICD-10-CM

## 2020-03-26 DIAGNOSIS — Z1231 Encounter for screening mammogram for malignant neoplasm of breast: Secondary | ICD-10-CM | POA: Diagnosis not present

## 2020-04-19 DIAGNOSIS — H5203 Hypermetropia, bilateral: Secondary | ICD-10-CM | POA: Diagnosis not present

## 2020-04-19 DIAGNOSIS — H52203 Unspecified astigmatism, bilateral: Secondary | ICD-10-CM | POA: Diagnosis not present

## 2020-04-19 DIAGNOSIS — H524 Presbyopia: Secondary | ICD-10-CM | POA: Diagnosis not present

## 2020-04-19 DIAGNOSIS — H25813 Combined forms of age-related cataract, bilateral: Secondary | ICD-10-CM | POA: Diagnosis not present

## 2020-04-27 DIAGNOSIS — D225 Melanocytic nevi of trunk: Secondary | ICD-10-CM | POA: Diagnosis not present

## 2020-04-27 DIAGNOSIS — L814 Other melanin hyperpigmentation: Secondary | ICD-10-CM | POA: Diagnosis not present

## 2020-04-27 DIAGNOSIS — Z85828 Personal history of other malignant neoplasm of skin: Secondary | ICD-10-CM | POA: Diagnosis not present

## 2020-04-27 DIAGNOSIS — L821 Other seborrheic keratosis: Secondary | ICD-10-CM | POA: Diagnosis not present

## 2020-07-15 DIAGNOSIS — F39 Unspecified mood [affective] disorder: Secondary | ICD-10-CM | POA: Diagnosis not present

## 2020-07-15 DIAGNOSIS — M545 Low back pain, unspecified: Secondary | ICD-10-CM | POA: Diagnosis not present

## 2020-07-15 DIAGNOSIS — Z0000 Encounter for general adult medical examination without abnormal findings: Secondary | ICD-10-CM | POA: Diagnosis not present

## 2020-07-15 DIAGNOSIS — K219 Gastro-esophageal reflux disease without esophagitis: Secondary | ICD-10-CM | POA: Diagnosis not present

## 2020-07-15 DIAGNOSIS — Z1389 Encounter for screening for other disorder: Secondary | ICD-10-CM | POA: Diagnosis not present

## 2020-07-15 DIAGNOSIS — E7800 Pure hypercholesterolemia, unspecified: Secondary | ICD-10-CM | POA: Diagnosis not present

## 2020-07-15 DIAGNOSIS — F419 Anxiety disorder, unspecified: Secondary | ICD-10-CM | POA: Diagnosis not present

## 2020-07-15 DIAGNOSIS — M8588 Other specified disorders of bone density and structure, other site: Secondary | ICD-10-CM | POA: Diagnosis not present

## 2020-07-21 ENCOUNTER — Other Ambulatory Visit: Payer: Self-pay | Admitting: Family Medicine

## 2020-07-21 DIAGNOSIS — M8580 Other specified disorders of bone density and structure, unspecified site: Secondary | ICD-10-CM

## 2020-07-29 ENCOUNTER — Other Ambulatory Visit: Payer: Self-pay | Admitting: Family Medicine

## 2020-07-29 DIAGNOSIS — Z1231 Encounter for screening mammogram for malignant neoplasm of breast: Secondary | ICD-10-CM

## 2020-11-04 DIAGNOSIS — F3181 Bipolar II disorder: Secondary | ICD-10-CM | POA: Diagnosis not present

## 2021-01-06 DIAGNOSIS — R059 Cough, unspecified: Secondary | ICD-10-CM | POA: Diagnosis not present

## 2021-01-06 DIAGNOSIS — F3181 Bipolar II disorder: Secondary | ICD-10-CM | POA: Diagnosis not present

## 2021-01-06 DIAGNOSIS — Z6839 Body mass index (BMI) 39.0-39.9, adult: Secondary | ICD-10-CM | POA: Diagnosis not present

## 2021-03-21 DIAGNOSIS — H52213 Irregular astigmatism, bilateral: Secondary | ICD-10-CM | POA: Diagnosis not present

## 2021-03-21 DIAGNOSIS — H524 Presbyopia: Secondary | ICD-10-CM | POA: Diagnosis not present

## 2021-03-21 DIAGNOSIS — H2512 Age-related nuclear cataract, left eye: Secondary | ICD-10-CM | POA: Diagnosis not present

## 2021-03-21 DIAGNOSIS — H40033 Anatomical narrow angle, bilateral: Secondary | ICD-10-CM | POA: Diagnosis not present

## 2021-03-21 DIAGNOSIS — H2513 Age-related nuclear cataract, bilateral: Secondary | ICD-10-CM | POA: Diagnosis not present

## 2021-03-21 DIAGNOSIS — H35363 Drusen (degenerative) of macula, bilateral: Secondary | ICD-10-CM | POA: Diagnosis not present

## 2021-03-21 DIAGNOSIS — H25013 Cortical age-related cataract, bilateral: Secondary | ICD-10-CM | POA: Diagnosis not present

## 2021-03-28 ENCOUNTER — Ambulatory Visit
Admission: RE | Admit: 2021-03-28 | Discharge: 2021-03-28 | Disposition: A | Discharge status: Home / Self Care | Payer: Medicare HMO | Source: Ambulatory Visit | Attending: Family Medicine | Admitting: Family Medicine

## 2021-03-28 DIAGNOSIS — Z1231 Encounter for screening mammogram for malignant neoplasm of breast: Secondary | ICD-10-CM | POA: Diagnosis not present

## 2021-03-28 DIAGNOSIS — Z780 Asymptomatic menopausal state: Secondary | ICD-10-CM | POA: Diagnosis not present

## 2021-03-28 DIAGNOSIS — M8580 Other specified disorders of bone density and structure, unspecified site: Secondary | ICD-10-CM

## 2021-03-28 DIAGNOSIS — M85852 Other specified disorders of bone density and structure, left thigh: Secondary | ICD-10-CM | POA: Diagnosis not present

## 2021-05-12 DIAGNOSIS — L853 Xerosis cutis: Secondary | ICD-10-CM | POA: Diagnosis not present

## 2021-05-12 DIAGNOSIS — Z85828 Personal history of other malignant neoplasm of skin: Secondary | ICD-10-CM | POA: Diagnosis not present

## 2021-05-12 DIAGNOSIS — L821 Other seborrheic keratosis: Secondary | ICD-10-CM | POA: Diagnosis not present

## 2021-06-07 DIAGNOSIS — H25812 Combined forms of age-related cataract, left eye: Secondary | ICD-10-CM | POA: Diagnosis not present

## 2021-06-07 DIAGNOSIS — H2512 Age-related nuclear cataract, left eye: Secondary | ICD-10-CM | POA: Diagnosis not present

## 2021-06-27 DIAGNOSIS — H2511 Age-related nuclear cataract, right eye: Secondary | ICD-10-CM | POA: Diagnosis not present

## 2021-06-27 DIAGNOSIS — H25011 Cortical age-related cataract, right eye: Secondary | ICD-10-CM | POA: Diagnosis not present

## 2021-07-05 DIAGNOSIS — H25011 Cortical age-related cataract, right eye: Secondary | ICD-10-CM | POA: Diagnosis not present

## 2021-07-05 DIAGNOSIS — H25811 Combined forms of age-related cataract, right eye: Secondary | ICD-10-CM | POA: Diagnosis not present

## 2021-07-05 DIAGNOSIS — H2511 Age-related nuclear cataract, right eye: Secondary | ICD-10-CM | POA: Diagnosis not present

## 2021-08-19 DIAGNOSIS — F3181 Bipolar II disorder: Secondary | ICD-10-CM | POA: Diagnosis not present

## 2021-08-19 DIAGNOSIS — F419 Anxiety disorder, unspecified: Secondary | ICD-10-CM | POA: Diagnosis not present

## 2021-08-19 DIAGNOSIS — K219 Gastro-esophageal reflux disease without esophagitis: Secondary | ICD-10-CM | POA: Diagnosis not present

## 2021-08-19 DIAGNOSIS — E7800 Pure hypercholesterolemia, unspecified: Secondary | ICD-10-CM | POA: Diagnosis not present

## 2021-08-19 DIAGNOSIS — R69 Illness, unspecified: Secondary | ICD-10-CM | POA: Diagnosis not present

## 2021-08-19 DIAGNOSIS — Z0000 Encounter for general adult medical examination without abnormal findings: Secondary | ICD-10-CM | POA: Diagnosis not present

## 2021-08-19 DIAGNOSIS — Z6838 Body mass index (BMI) 38.0-38.9, adult: Secondary | ICD-10-CM | POA: Diagnosis not present

## 2021-08-19 DIAGNOSIS — M545 Low back pain, unspecified: Secondary | ICD-10-CM | POA: Diagnosis not present

## 2021-08-19 DIAGNOSIS — M8588 Other specified disorders of bone density and structure, other site: Secondary | ICD-10-CM | POA: Diagnosis not present

## 2021-08-19 DIAGNOSIS — Z1331 Encounter for screening for depression: Secondary | ICD-10-CM | POA: Diagnosis not present

## 2021-08-19 DIAGNOSIS — G44209 Tension-type headache, unspecified, not intractable: Secondary | ICD-10-CM | POA: Diagnosis not present

## 2021-11-01 DIAGNOSIS — E7800 Pure hypercholesterolemia, unspecified: Secondary | ICD-10-CM | POA: Diagnosis not present

## 2022-02-21 ENCOUNTER — Ambulatory Visit: Payer: Medicare HMO | Admitting: Family Medicine

## 2022-02-24 ENCOUNTER — Other Ambulatory Visit: Payer: Self-pay | Admitting: Family Medicine

## 2022-02-24 DIAGNOSIS — Z1231 Encounter for screening mammogram for malignant neoplasm of breast: Secondary | ICD-10-CM

## 2022-02-28 ENCOUNTER — Ambulatory Visit (INDEPENDENT_AMBULATORY_CARE_PROVIDER_SITE_OTHER): Payer: Medicare HMO | Admitting: Family Medicine

## 2022-02-28 ENCOUNTER — Encounter: Payer: Self-pay | Admitting: Family Medicine

## 2022-02-28 VITALS — BP 132/84 | HR 78 | Temp 97.7°F | Ht 63.0 in | Wt 215.0 lb

## 2022-02-28 DIAGNOSIS — Z114 Encounter for screening for human immunodeficiency virus [HIV]: Secondary | ICD-10-CM | POA: Diagnosis not present

## 2022-02-28 DIAGNOSIS — Z6838 Body mass index (BMI) 38.0-38.9, adult: Secondary | ICD-10-CM

## 2022-02-28 DIAGNOSIS — Z7689 Persons encountering health services in other specified circumstances: Secondary | ICD-10-CM

## 2022-02-28 DIAGNOSIS — E7800 Pure hypercholesterolemia, unspecified: Secondary | ICD-10-CM | POA: Diagnosis not present

## 2022-02-28 DIAGNOSIS — L739 Follicular disorder, unspecified: Secondary | ICD-10-CM | POA: Diagnosis not present

## 2022-02-28 DIAGNOSIS — E66812 Obesity, class 2: Secondary | ICD-10-CM

## 2022-02-28 DIAGNOSIS — K219 Gastro-esophageal reflux disease without esophagitis: Secondary | ICD-10-CM | POA: Diagnosis not present

## 2022-02-28 DIAGNOSIS — Z1159 Encounter for screening for other viral diseases: Secondary | ICD-10-CM | POA: Diagnosis not present

## 2022-02-28 DIAGNOSIS — Z1329 Encounter for screening for other suspected endocrine disorder: Secondary | ICD-10-CM | POA: Diagnosis not present

## 2022-02-28 DIAGNOSIS — R69 Illness, unspecified: Secondary | ICD-10-CM | POA: Diagnosis not present

## 2022-02-28 MED ORDER — MUPIROCIN 2 % EX OINT: 1.0000 | TOPICAL_OINTMENT | Freq: Two times a day (BID) | CUTANEOUS | 0 refills | Status: DC

## 2022-02-28 NOTE — Assessment & Plan Note (Signed)
Well controlled on Protonix.

## 2022-02-28 NOTE — Patient Instructions (Signed)
It was great to meet you today and I'm excited to have you join the [facility] practice. I hope you had a positive experience today! If you feel so inclined, please feel free to recommend our practice to friends and family. Mila Merry, FNP-C

## 2022-02-28 NOTE — Progress Notes (Signed)
New Patient Office Visit  Subjective    Patient ID: Anna Hodges, female    DOB: 14-Nov-1950  Age: 72 y.o. MRN: [id number]  CC:  Chief Complaint  Patient presents with   Establish Care    HPI Anna Hodges presents to establish care. Oriented to practice routines and expectations. She has a PMH significant for HLD, obesity, GERD, osteopenia, DDD, and spinal stenosis with prior cervical fusion, psoriatic arthritis. Last physical was 08/19/2021. Concerns today include a bump and rash on her left shin.  DEXA: 2023 showed osteopenia Breast CA: scheduled next month Colon CA: colonoscopy done, q5y, benign polyps, next in 2y Tobacco use: former Vaccines: UTD BMI 38.09    Outpatient Encounter Medications as of 02/28/2022  Medication Sig   aspirin EC 81 MG tablet Take 81 mg by mouth daily.   atorvastatin (LIPITOR) 40 MG tablet Take 40 mg by mouth daily.   cyclobenzaprine (FLEXERIL) 10 MG tablet    mupirocin ointment (BACTROBAN) 2 % Apply 1 Application topically 2 (two) times daily.   nabumetone (RELAFEN) 500 MG tablet Take 500 mg by mouth 2 (two) times daily.   pantoprazole (PROTONIX) 40 MG tablet Take 1 tablet (40 mg total) by mouth daily. Take 30-60 min before first meal of the day   promethazine (PHENERGAN) 6.25 MG/5ML syrup Take by mouth every 6 (six) hours as needed for nausea or vomiting.   [DISCONTINUED] Cholecalciferol (VITAMIN D) 50 MCG (2000 UT) tablet 1 tablet   [DISCONTINUED] citalopram (CELEXA) 40 MG tablet Take 40 mg by mouth daily.   [DISCONTINUED] Ascorbic Acid (VITAMIN C) 1000 MG tablet 1 tablet   [DISCONTINUED] calcium carbonate (OSCAL) 1500 (600 Ca) MG TABS tablet 1 tablet with meals   [DISCONTINUED] lamoTRIgine (LAMICTAL) 100 MG tablet    [DISCONTINUED] lamoTRIgine (LAMICTAL) 25 MG tablet Take by mouth.   [DISCONTINUED] lidocaine (LIDODERM) 5 % Place 1 patch onto the skin daily. Remove & Discard patch within 12 hours or as directed by MD    [DISCONTINUED] pantoprazole (PROTONIX) 40 MG tablet Take 1 tablet (40 mg total) by mouth daily.   [DISCONTINUED] pantoprazole (PROTONIX) 40 MG tablet TAKE 1 TABLET BY MOUTH EVERY DAY 30 TO 60 MINUTES BEFORE FIRST MEAL OF THE DAY   [DISCONTINUED] pravastatin (PRAVACHOL) 40 MG tablet    [DISCONTINUED] PROAIR HFA 108 (90 Base) MCG/ACT inhaler Inhale 2 puffs into the lungs every other day as needed.   No facility-administered encounter medications on file as of 02/28/2022.    Past Medical History:  Diagnosis Date   Arthritis    mainly in all her joints   GERD (gastroesophageal reflux disease)    PFO (patent foramen ovale)    01/05/10: transcranial doppler bubble study (Dr. Leonie Man): suggestive of small right to left intracardiac shunt. He felt unlikely to be of clinical significance. Rx ASA.    Past Surgical History:  Procedure Laterality Date   ABDOMINAL HYSTERECTOMY     BACK SURGERY     1 lumbar   BREAST SURGERY     bilateral implants   CERVICAL SPINE SURGERY     TOTAL SHOULDER ARTHROPLASTY Left 01/22/2015   Procedure: LEFT TOTAL SHOULDER ARTHROPLASTY;  Surgeon: Netta Cedars, MD;  Location: [facility];  Service: Orthopedics;  Laterality: Left;    Family History  Problem Relation Age of Onset   Breast cancer Neg Hx     Social History   Socioeconomic History   Marital status: Divorced    Spouse name: Not on file  Number of children: Not on file   Years of education: Not on file   Highest education level: Not on file  Occupational History   Not on file  Tobacco Use   Smoking status: Former    Packs/day: 0.25    Years: 6.00    Total pack years: 1.50    Types: Cigarettes    Quit date: 01/16/1997    Years since quitting: 25.1   Smokeless tobacco: Never  Substance and Sexual Activity   Alcohol use: No    Alcohol/week: 3.0 standard drinks of alcohol    Types: 3 Glasses of wine per week   Drug use: No   Sexual activity: Not on file  Other Topics Concern   Not on file  Social  History Narrative   Not on file   Social Determinants of Health   Financial Resource Strain: Not on file  Food Insecurity: Not on file  Transportation Needs: Not on file  Physical Activity: Not on file  Stress: Not on file  Social Connections: Not on file  Intimate Partner Violence: Not on file    Review of Systems  All other systems reviewed and are negative.       Objective    BP 132/84   Pulse 78   Temp 97.7 F (36.5 C) (Oral)   Ht 5' 3"$  (1.6 m)   Wt 215 lb (97.5 kg)   SpO2 97%   BMI 38.09 kg/m   Physical Exam Vitals and nursing note reviewed.  Constitutional:      Appearance: Normal appearance. She is normal weight.  HENT:     Head: Normocephalic and atraumatic.  Cardiovascular:     Rate and Rhythm: Normal rate and regular rhythm.     Pulses: Normal pulses.     Heart sounds: Normal heart sounds.  Pulmonary:     Effort: Pulmonary effort is normal.     Breath sounds: Normal breath sounds.  Skin:    General: Skin is warm and dry.  Neurological:     General: No focal deficit present.     Mental Status: She is alert and oriented to person, place, and time. Mental status is at baseline.  Psychiatric:        Mood and Affect: Mood normal.        Behavior: Behavior normal.        Thought Content: Thought content normal.        Judgment: Judgment normal.         Assessment & Plan:   Problem List Items Addressed This Visit       Digestive   Gastro-esophageal reflux disease without esophagitis    Well controlled on Protonix.        Musculoskeletal and Integument   Folliculitis - Primary    Small area of erythematous rash to left shin consistent with folliculitis. One 1cm x 1cm area of erythema in the central location. No edema or drainage. Will start Bactroban 2% BID and if no improvement in symptoms return to office.        Other   Pure hypercholesterolemia    Last lipids in October 2023. Continue Atorvastatin. Will recheck at physical.       Relevant Medications   atorvastatin (LIPITOR) 40 MG tablet   Class 2 severe obesity with serious comorbidity and body mass index (BMI) of 38.0 to 38.9 in adult [facility])    Patient is concerned about her weight. She reports eating only a small breakfast and lunch, just  two meals a day that include vegetables. She is interested in MWM, referral placed. Will draw TSH and CMP. Encouraged her to use an app such as my fitness pal to track meals. Encouraged 150 minutes moderate intensity exercise weekly. She will also check her coverage for weight loss medications, has tried phentermine and another injectable in the past.      Relevant Orders   Amb Ref to Medical Weight Management   COMPLETE METABOLIC PANEL WITH GFR   TSH   Encounter to establish care with new doctor    Today we reviewed your medical history and discussed any current concerns. HM items up to date. Return for complete physical with labs on or around 08/20/2022.      Other Visit Diagnoses     Screening for HIV (human immunodeficiency virus)       Relevant Orders   HIV Antibody (routine testing w rflx)   Need for hepatitis C screening test       Relevant Orders   Hepatitis C antibody       Return in about 6 months (around 08/21/2022) for annual physical.   Rubie Maid, FNP

## 2022-02-28 NOTE — Assessment & Plan Note (Signed)
Last lipids in October 2023. Continue Atorvastatin. Will recheck at physical.

## 2022-02-28 NOTE — Assessment & Plan Note (Signed)
Small area of erythematous rash to left shin consistent with folliculitis. One 1cm x 1cm area of erythema in the central location. No edema or drainage. Will start Bactroban 2% BID and if no improvement in symptoms return to office.

## 2022-02-28 NOTE — Assessment & Plan Note (Addendum)
Patient is concerned about her weight. She reports eating only a small breakfast and lunch, just two meals a day that include vegetables. She is interested in MWM, referral placed. Will draw TSH and CMP. Encouraged her to use an app such as my fitness pal to track meals. Encouraged 150 minutes moderate intensity exercise weekly. She will also check her coverage for weight loss medications, has tried phentermine and another injectable in the past.

## 2022-02-28 NOTE — Assessment & Plan Note (Signed)
Today we reviewed your medical history and discussed any current concerns. HM items up to date. Return for complete physical with labs on or around 08/20/2022.

## 2022-03-01 LAB — HEPATITIS C ANTIBODY: Hepatitis C Ab: NONREACTIVE

## 2022-03-01 LAB — COMPLETE METABOLIC PANEL WITH GFR
AG Ratio: 1.6 (calc) (ref 1.0–2.5)
ALT: 22 U/L (ref 6–29)
AST: 20 U/L (ref 10–35)
Albumin: 4.3 g/dL (ref 3.6–5.1)
Alkaline phosphatase (APISO): 87 U/L (ref 37–153)
BUN: 13 mg/dL (ref 7–25)
CO2: 26 mmol/L (ref 20–32)
Calcium: 9.6 mg/dL (ref 8.6–10.4)
Chloride: 105 mmol/L (ref 98–110)
Creat: 0.53 mg/dL (ref 0.50–1.05)
Globulin: 2.7 g/dL (calc) (ref 1.9–3.7)
Glucose, Bld: 90 mg/dL (ref 65–99)
Potassium: 4.2 mmol/L (ref 3.5–5.3)
Sodium: 140 mmol/L (ref 135–146)
Total Bilirubin: 0.5 mg/dL (ref 0.2–1.2)
Total Protein: 7 g/dL (ref 6.1–8.1)
eGFR: 100 mL/min/{1.73_m2} (ref >=60)

## 2022-03-01 LAB — HIV ANTIBODY (ROUTINE TESTING W REFLEX): HIV 1&2 Ab, 4th Generation: NONREACTIVE

## 2022-03-01 LAB — TSH: TSH: 1.11 mIU/L (ref 0.40–4.50)

## 2022-03-02 ENCOUNTER — Encounter: Payer: Self-pay | Admitting: Family Medicine

## 2022-03-13 ENCOUNTER — Telehealth: Payer: Self-pay | Admitting: Family Medicine

## 2022-03-13 NOTE — Telephone Encounter (Signed)
Patient called to report she received a call about an outstanding bill for $395 for DOS 02/28/22. Patient has insurance and is requesting for bill to be resubmitted. Advised patient to call billing department.   [employer] ID # [id number]   Please advise patient if any additional information is required at [phone].

## 2022-03-15 DIAGNOSIS — L570 Actinic keratosis: Secondary | ICD-10-CM | POA: Diagnosis not present

## 2022-03-15 DIAGNOSIS — L308 Other specified dermatitis: Secondary | ICD-10-CM | POA: Diagnosis not present

## 2022-03-15 DIAGNOSIS — D485 Neoplasm of uncertain behavior of skin: Secondary | ICD-10-CM | POA: Diagnosis not present

## 2022-03-15 DIAGNOSIS — Z85828 Personal history of other malignant neoplasm of skin: Secondary | ICD-10-CM | POA: Diagnosis not present

## 2022-04-12 ENCOUNTER — Telehealth: Payer: Self-pay | Admitting: Family Medicine

## 2022-04-12 ENCOUNTER — Ambulatory Visit: Payer: Medicare HMO

## 2022-04-12 NOTE — Telephone Encounter (Signed)
Contacted [facility] to schedule their annual wellness visit. Appointment made for 04/20/2022.  Thank you,  Colletta Maryland,  [facility] Direct Dial ??[phone]

## 2022-04-27 ENCOUNTER — Telehealth: Payer: Self-pay | Admitting: Family Medicine

## 2022-04-27 NOTE — Telephone Encounter (Signed)
Contacted [facility] to schedule their annual wellness visit. Appointment made for 08/24/2022.  Per snapshot and mychart message from patient not due until after 08/20/2022  Thank you,  Judeth Cornfield,  [facility] Clinical Support [facility] Direct Dial ??[phone]

## 2022-05-02 DIAGNOSIS — L821 Other seborrheic keratosis: Secondary | ICD-10-CM | POA: Diagnosis not present

## 2022-05-02 DIAGNOSIS — D225 Melanocytic nevi of trunk: Secondary | ICD-10-CM | POA: Diagnosis not present

## 2022-05-02 DIAGNOSIS — Z85828 Personal history of other malignant neoplasm of skin: Secondary | ICD-10-CM | POA: Diagnosis not present

## 2022-05-02 DIAGNOSIS — L720 Epidermal cyst: Secondary | ICD-10-CM | POA: Diagnosis not present

## 2022-05-09 DIAGNOSIS — Z961 Presence of intraocular lens: Secondary | ICD-10-CM | POA: Diagnosis not present

## 2022-05-09 DIAGNOSIS — H35413 Lattice degeneration of retina, bilateral: Secondary | ICD-10-CM | POA: Diagnosis not present

## 2022-05-26 ENCOUNTER — Ambulatory Visit
Admission: RE | Admit: 2022-05-26 | Discharge: 2022-05-26 | Disposition: A | Discharge status: Home / Self Care | Payer: Medicare HMO | Source: Ambulatory Visit | Attending: Family Medicine | Admitting: Family Medicine

## 2022-05-26 DIAGNOSIS — Z1231 Encounter for screening mammogram for malignant neoplasm of breast: Secondary | ICD-10-CM

## 2022-05-29 NOTE — Progress Notes (Signed)
Results of patient's mammogram

## 2022-05-29 NOTE — Progress Notes (Signed)
I was sent this patient's mammogram, she has never established with me, and I saw on my schedule that she is with me in July. Looks like she is still seeing [facility]. Not sure if this is a TOC or by accident?

## 2022-06-23 ENCOUNTER — Encounter: Payer: Self-pay | Admitting: Family

## 2022-06-23 ENCOUNTER — Ambulatory Visit (INDEPENDENT_AMBULATORY_CARE_PROVIDER_SITE_OTHER): Payer: Medicare HMO | Admitting: Family

## 2022-06-23 VITALS — BP 132/80 | HR 67 | Temp 98.0°F | Ht 63.0 in | Wt 186.8 lb

## 2022-06-23 DIAGNOSIS — R058 Other specified cough: Secondary | ICD-10-CM | POA: Diagnosis not present

## 2022-06-23 DIAGNOSIS — L4052 Psoriatic arthritis mutilans: Secondary | ICD-10-CM

## 2022-06-23 DIAGNOSIS — L820 Inflamed seborrheic keratosis: Secondary | ICD-10-CM

## 2022-06-23 DIAGNOSIS — R238 Other skin changes: Secondary | ICD-10-CM | POA: Insufficient documentation

## 2022-06-23 MED ORDER — VALACYCLOVIR HCL 1 G PO TABS: 1000.0000 mg | ORAL_TABLET | Freq: Three times a day (TID) | ORAL | 0 refills | Status: AC

## 2022-06-23 NOTE — Progress Notes (Signed)
New Patient Office Visit  Subjective:  Patient ID: Anna Hodges, female    DOB: [birth year]  Age: 72 y.o. MRN: [id number]  CC:  Chief Complaint  Patient presents with   Blister    Blister on left pinky finger.    HPI Anna Hodges is here to establish care as a new patient.  Oriented to practice routines and expectations.  Prior provider was: Kurtis Bushman, FNP was with [facility]   Pt is with acute concerns.  About five days ago started with a blister like itchy lesion on her lateral side of her left pinky finger. At first couldn't bend the finger but states bending slightly more since starting.   Left third finger with several months and hard to bend. Slight improvement but at times will pop when she goes to extend them out fully. She can not grip anything well in her left hand because she is having trouble closing her hand into a fist.    chronic concerns:  Coughing spells, ws dx in the past with 'upper respiratory cough syndrome' she states she takes promethazine when this occurs to stop the cough spasms.   Hld: on atorvastatin 40 mg once nightly, doing well tolerating well.   Arthritis: is on relafen   Gerd: pantoprazole 40 mg once daily. Has had endoscopy in the psat   Lichenoid keratosis: regularly screenings with her dermatology   ROS: Negative unless specifically indicated above in HPI.   Current Outpatient Medications:    valACYclovir (VALTREX) 1000 MG tablet, Take 1 tablet (1,000 mg total) by mouth 3 (three) times daily for 7 days., Disp: 21 tablet, Rfl: 0   aspirin EC 81 MG tablet, Take 81 mg by mouth daily., Disp: , Rfl:    atorvastatin (LIPITOR) 40 MG tablet, Take 40 mg by mouth daily., Disp: , Rfl:    cyclobenzaprine (FLEXERIL) 10 MG tablet, , Disp: , Rfl:    nabumetone (RELAFEN) 500 MG tablet, Take 500 mg by mouth 2 (two) times daily., Disp: , Rfl: 6   pantoprazole (PROTONIX) 40 MG tablet, Take 1 tablet (40 mg total) by  mouth daily. Take 30-60 min before first meal of the day, Disp: 30 tablet, Rfl: 2   promethazine (PHENERGAN) 6.25 MG/5ML syrup, Take by mouth every 6 (six) hours as needed for nausea or vomiting., Disp: , Rfl:    triamcinolone cream (KENALOG) 0.1 %, Apply 1 Application topically 2 (two) times daily., Disp: , Rfl:  Past Medical History:  Diagnosis Date   Allergy    Anxiety    Arthritis    mainly in all her joints   GERD (gastroesophageal reflux disease)    PFO (patent foramen ovale)    01/05/10: transcranial doppler bubble study (Dr. Pearlean Brownie): suggestive of small right to left intracardiac shunt. He felt unlikely to be of clinical significance. Rx ASA.   Past Surgical History:  Procedure Laterality Date   ABDOMINAL HYSTERECTOMY     BACK SURGERY     1 lumbar   BREAST SURGERY     bilateral implants   CERVICAL SPINE SURGERY     COSMETIC SURGERY     EYE SURGERY  2033   JOINT REPLACEMENT  01/22/2015   SPINE SURGERY     TOTAL SHOULDER ARTHROPLASTY Left 01/22/2015   Procedure: LEFT TOTAL SHOULDER ARTHROPLASTY;  Surgeon: Beverely Low, MD;  Location: [facility] OR;  Service: Orthopedics;  Laterality: Left;    Objective:   Today's Vitals: BP 132/80   Pulse  67   Temp 98 F (36.7 C) (Temporal)   Ht 5\' 3"  (1.6 m)   Wt 186 lb 12.8 oz (84.7 kg)   SpO2 98%   BMI 33.09 kg/m   Physical Exam Vitals reviewed.  Constitutional:      General: She is not in acute distress.    Appearance: Normal appearance. She is normal weight. She is not ill-appearing, toxic-appearing or diaphoretic.  HENT:     Head: Normocephalic.  Cardiovascular:     Rate and Rhythm: Normal rate and regular rhythm.  Pulmonary:     Effort: Pulmonary effort is normal.     Breath sounds: Normal breath sounds.  Musculoskeletal:        General: Normal range of motion.     Comments: Third metacarpal left side with edema throughout finger as well as tenderness at distal phalynx. Some nodules on distal phalynx on multiple fingers   Skin:    Comments: Raised blister with redness on lateral side of 5th metacarpal on left hand. Nontender.     Neurological:     General: No focal deficit present.     Mental Status: She is alert and oriented to person, place, and time. Mental status is at baseline.  Psychiatric:        Mood and Affect: Mood normal.        Behavior: Behavior normal.        Thought Content: Thought content normal.        Judgment: Judgment normal.     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  Referral placed for eval/treat as symptomatic Advised pt we will need to obtain records from prior rheumatology   Orders: -     Ambulatory referral to Rheumatology  Vesicular lesion Assessment & Plan: Possible herpatic whitlow vs insect bite.  Trial valtrex tid 1 g x 7 days  If no improvement please f/u with derm   Orders: -     valACYclovir HCl; Take 1 tablet (1,000 mg total) by mouth 3 (three) times daily for 7 days.  Dispense: 21 tablet; Refill: 0  Lichenoid keratosis Assessment & Plan: Sees dermatology regularly    Upper airway cough syndrome Assessment & Plan: stable     Follow-up: Return for f/u CPE.   Mort Sawyers, FNP

## 2022-06-23 NOTE — Assessment & Plan Note (Signed)
stable °

## 2022-06-23 NOTE — Assessment & Plan Note (Signed)
Not under rheumatologist care at current.  Referral placed for eval/treat as symptomatic Advised pt we will need to obtain records from prior rheumatology

## 2022-06-23 NOTE — Assessment & Plan Note (Signed)
Possible herpatic whitlow vs insect bite.  Trial valtrex tid 1 g x 7 days  If no improvement please f/u with derm

## 2022-06-23 NOTE — Assessment & Plan Note (Signed)
Sees dermatology regularly.  

## 2022-07-17 ENCOUNTER — Ambulatory Visit: Payer: Medicare HMO | Admitting: Family

## 2022-08-09 ENCOUNTER — Other Ambulatory Visit: Payer: Self-pay | Admitting: Family

## 2022-08-10 NOTE — Telephone Encounter (Signed)
I do not have any history documented for neck pain?  Is this a new concern?

## 2022-08-16 NOTE — Telephone Encounter (Signed)
Unable to reach patient. Left voicemail to return call to our office.   

## 2022-08-16 NOTE — Telephone Encounter (Signed)
She states she has had trouble with her neck pain in the past, she states her neurosurgeon Is the one she used to prescribe it for her. She only uses as needed.  Patient states she will wait until appt on 08/05 and discuss more with [name] then.

## 2022-08-18 MED ORDER — CYCLOBENZAPRINE HCL 10 MG PO TABS: 10.0000 mg | ORAL_TABLET | Freq: Every day | ORAL | 0 refills | Status: AC | PRN

## 2022-08-21 ENCOUNTER — Encounter: Payer: Self-pay | Admitting: Family

## 2022-08-21 ENCOUNTER — Ambulatory Visit (INDEPENDENT_AMBULATORY_CARE_PROVIDER_SITE_OTHER): Payer: Medicare HMO | Admitting: Family

## 2022-08-21 VITALS — BP 132/82 | HR 78 | Temp 98.7°F | Ht 61.5 in | Wt 181.0 lb

## 2022-08-21 DIAGNOSIS — E7800 Pure hypercholesterolemia, unspecified: Secondary | ICD-10-CM

## 2022-08-21 DIAGNOSIS — F39 Unspecified mood [affective] disorder: Secondary | ICD-10-CM | POA: Diagnosis not present

## 2022-08-21 DIAGNOSIS — Z0001 Encounter for general adult medical examination with abnormal findings: Secondary | ICD-10-CM | POA: Insufficient documentation

## 2022-08-21 DIAGNOSIS — F419 Anxiety disorder, unspecified: Secondary | ICD-10-CM

## 2022-08-21 DIAGNOSIS — L4052 Psoriatic arthritis mutilans: Secondary | ICD-10-CM

## 2022-08-21 LAB — LIPID PANEL
Cholesterol: 182 mg/dL (ref 0–200)
HDL: 66.9 mg/dL (ref >39.00)
LDL Cholesterol: 98 mg/dL (ref 0–99)
NonHDL: 115.11
Total CHOL/HDL Ratio: 3
Triglycerides: 85 mg/dL (ref 0.0–149.0)
VLDL: 17 mg/dL (ref 0.0–40.0)

## 2022-08-21 MED ORDER — FLUOXETINE HCL 20 MG PO TABS: 20.0000 mg | ORAL_TABLET | Freq: Every day | ORAL | 0 refills | Status: DC

## 2022-08-21 NOTE — Assessment & Plan Note (Signed)
Trial prozac 20 mg once daily  If no relief, may consider psychiatry consult for possible h/o bipolar

## 2022-08-21 NOTE — Assessment & Plan Note (Signed)
Patient Counseling(The following topics were reviewed): ? Preventative care handout given to pt  ?Health maintenance and immunizations reviewed. Please refer to Health maintenance section. ?Pt advised on safe sex, wearing seatbelts in car, and proper nutrition ?labwork ordered today for annual ?Dental health: Discussed importance of regular tooth brushing, flossing, and dental visits. ? ? ?
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
## 2022-09-06 ENCOUNTER — Ambulatory Visit (INDEPENDENT_AMBULATORY_CARE_PROVIDER_SITE_OTHER): Payer: Medicare HMO

## 2022-09-06 VITALS — Wt 181.0 lb

## 2022-09-06 DIAGNOSIS — Z0000 Encounter for general adult medical examination without abnormal findings: Secondary | ICD-10-CM | POA: Diagnosis not present

## 2022-09-06 NOTE — Progress Notes (Signed)
Subjective:   Anna Hodges is a 72 y.o. female who presents for Medicare Annual (Subsequent) preventive examination.  Visit Complete: Virtual  I connected with  Anna Hodges on 09/06/22 by a audio enabled telemedicine application and verified that I am speaking with the correct person using two identifiers.  Patient Location: Home  Provider Location: Home Office  I discussed the limitations of evaluation and management by telemedicine. The patient expressed understanding and agreed to proceed.  Patient Medicare AWV questionnaire was completed by the patient on 09/05/22; I have confirmed that all information answered by patient is correct and no changes since this date.    Vital Signs: Unable to obtain new vitals due to this being a telehealth visit.  Review of Systems     Cardiac Risk Factors include: advanced age (>37men, >12 women);dyslipidemia;obesity (BMI >30kg/m2)     Objective:    Today's Vitals   09/06/22 0857  Weight: 181 lb (82.1 kg)   Body mass index is 33.65 kg/m.     09/06/2022    9:01 AM 01/10/2020   12:04 PM 01/22/2015    3:40 PM 01/13/2015    3:28 PM  Advanced Directives  Does Patient Have a Medical Advance Directive? Yes No No No  Type of Estate agent of [place];Living will     Copy of Healthcare Power of Attorney in Chart? No - copy requested     Would patient like information on creating a medical advance directive?   No - patient declined information No - patient declined information    Current Medications (verified) Outpatient Encounter Medications as of 09/06/2022  Medication Sig   aspirin EC 81 MG tablet Take 81 mg by mouth daily.   atorvastatin (LIPITOR) 40 MG tablet Take 40 mg by mouth daily.   cyclobenzaprine (FLEXERIL) 10 MG tablet Take 1 tablet (10 mg total) by mouth daily as needed for muscle spasms.   FLUoxetine (PROZAC) 20 MG tablet Take 1 tablet (20 mg total) by mouth daily.   nabumetone  (RELAFEN) 500 MG tablet Take 500 mg by mouth 2 (two) times daily.   pantoprazole (PROTONIX) 40 MG tablet Take 1 tablet (40 mg total) by mouth daily. Take 30-60 min before first meal of the day   promethazine (PHENERGAN) 6.25 MG/5ML syrup Take by mouth every 6 (six) hours as needed for nausea or vomiting.   triamcinolone cream (KENALOG) 0.1 % Apply 1 Application topically 2 (two) times daily.   No facility-administered encounter medications on file as of 09/06/2022.    Allergies (verified) Codeine, Duloxetine, Lamictal [lamotrigine], and Pregabalin   History: Past Medical History:  Diagnosis Date   Allergy    Anxiety    Arthritis    mainly in all her joints   GERD (gastroesophageal reflux disease)    PFO (patent foramen ovale)    01/05/10: transcranial doppler bubble study (Dr. Pearlean Brownie): suggestive of small right to left intracardiac shunt. He felt unlikely to be of clinical significance. Rx ASA.   Past Surgical History:  Procedure Laterality Date   BACK SURGERY     1 lumbar   BREAST SURGERY     bilateral implants   CERVICAL SPINE SURGERY     COSMETIC SURGERY     EYE SURGERY  2033   JOINT REPLACEMENT  01/22/2015   SPINE SURGERY     TOTAL SHOULDER ARTHROPLASTY Left 01/22/2015   Procedure: LEFT TOTAL SHOULDER ARTHROPLASTY;  Surgeon: Beverely Low, MD;  Location: [facility] OR;  Service: Orthopedics;  Laterality: Left;   VAGINAL HYSTERECTOMY     still with ovaries   Family History  Problem Relation Age of Onset   Hypertension Mother    Hyperlipidemia Mother    Hypertension Father    Hyperlipidemia Father    Bipolar disorder Son    Bipolar disorder Granddaughter    Bipolar disorder Granddaughter    Breast cancer Neg Hx    Social History   Socioeconomic History   Marital status: Divorced    Spouse name: Not on file   Number of children: Not on file   Years of education: Not on file   Highest education level: GED or equivalent  Occupational History   Occupation: retired   Tobacco Use   Smoking status: Former    Current packs/day: 0.00    Average packs/day: 0.3 packs/day for 6.0 years (1.5 ttl pk-yrs)    Types: Cigarettes    Start date: 01/17/1991    Quit date: 01/16/1997    Years since quitting: 25.6   Smokeless tobacco: Never  Vaping Use   Vaping status: Never Used  Substance and Sexual Activity   Alcohol use: No    Alcohol/week: 3.0 standard drinks of alcohol    Types: 3 Glasses of wine per week   Drug use: No   Sexual activity: Not Currently    Partners: Male    Birth control/protection: Surgical  Other Topics Concern   Not on file  Social History Narrative   Not on file   Social Determinants of Health   Financial Resource Strain: Low Risk  (09/05/2022)   Overall Financial Resource Strain (CARDIA)    Difficulty of Paying Living Expenses: Not hard at all  Food Insecurity: No Food Insecurity (09/05/2022)   Hunger Vital Sign    Worried About Running Out of Food in the Last Year: Never true    Ran Out of Food in the Last Year: Never true  Transportation Needs: No Transportation Needs (09/05/2022)   PRAPARE - Administrator, Civil Service (Medical): No    Lack of Transportation (Non-Medical): No  Physical Activity: Sufficiently Active (09/05/2022)   Exercise Vital Sign    Days of Exercise per Week: 5 days    Minutes of Exercise per Session: 100 min  Stress: No Stress Concern Present (09/05/2022)   [employer] of Occupational Health - Occupational Stress Questionnaire    Feeling of Stress : Only a little  Social Connections: Socially Isolated (09/05/2022)   Social Connection and Isolation Panel [NHANES]    Frequency of Communication with Friends and Family: More than three times a week    Frequency of Social Gatherings with Friends and Family: More than three times a week    Attends Religious Services: Never    Database administrator or Organizations: No    Attends Engineer, structural: Not on file    Marital Status:  Divorced    Tobacco Counseling Counseling given: Not Answered   Clinical Intake:  Pre-visit preparation completed: Yes  Pain : No/denies pain     BMI - recorded: 33.65 Nutritional Status: BMI > 30  Obese Nutritional Risks: None Diabetes: No  How often do you need to have someone help you when you read instructions, pamphlets, or other written materials from your doctor or pharmacy?: 1 - Never  Interpreter Needed?: No  Information entered by :: Lanier Ensign, LPN   Activities of Daily Living    09/05/2022    3:26 PM  In your present  state of health, do you have any difficulty performing the following activities:  Hearing? 0  Vision? 0  Difficulty concentrating or making decisions? 0  Walking or climbing stairs? 0  Dressing or bathing? 0  Doing errands, shopping? 0  Preparing Food and eating ? N  Using the Toilet? N  In the past six months, have you accidently leaked urine? N  Do you have problems with loss of bowel control? N  Managing your Medications? N  Managing your Finances? N  Housekeeping or managing your Housekeeping? N    Patient Care Team: Mort Sawyers, FNP as PCP - General (Family Medicine)  Indicate any recent Medical Services you may have received from other than [facility] providers in the past year (date may be approximate).     Assessment:   This is a routine wellness examination for Anna Hodges.  Hearing/Vision screen Hearing Screening - Comments:: Pt denies any hearing issues  Vision Screening - Comments:: Pt follows up with Dr Dione Booze for annual eye exams   Dietary issues and exercise activities discussed:     Goals Addressed             This Visit's Progress    Patient Stated       Continue to lose weight        Depression Screen    09/06/2022    8:59 AM 08/21/2022    8:20 AM 06/23/2022    8:57 AM 02/28/2022    4:23 PM  PHQ 2/9 Scores  PHQ - 2 Score 0 1 0 0  PHQ- 9 Score 0 2 2 2     Fall Risk    09/05/2022    3:26 PM 08/21/2022     8:21 AM 06/23/2022    8:56 AM 02/28/2022    4:23 PM 02/28/2022   11:11 AM  Fall Risk   Falls in the past year? 1 1 1 1 1   Number falls in past yr: 1 0 1 1 1   Injury with Fall? 1 1 1 1 1   Comment right ankle and leg    hurt R-leg during Thanksgiving  Risk for fall due to : Impaired vision History of fall(s)     Follow up Falls prevention discussed Falls evaluation completed Falls evaluation completed;Education provided;Falls prevention discussed      MEDICARE RISK AT HOME: Medicare Risk at Home Any stairs in or around the home?: Yes If so, are there any without handrails?: Yes Home free of loose throw rugs in walkways, pet beds, electrical cords, etc?: No Adequate lighting in your home to reduce risk of falls?: Yes Life alert?: No Use of a cane, walker or w/c?: No Grab bars in the bathroom?: No Shower chair or bench in shower?: No Elevated toilet seat or a handicapped toilet?: Yes  TIMED UP AND GO:  Was the test performed?  no    Cognitive Function:        09/06/2022    9:01 AM  6CIT Screen  What Year? 0 points  What month? 0 points  What time? 0 points  Count back from 20 0 points  Months in reverse 0 points  Repeat phrase 0 points  Total Score 0 points    Immunizations Immunization History  Administered Date(s) Administered   Hepatitis B, PED/ADOLESCENT 04/02/1998, 05/21/1998, 11/12/1998   Influenza Split 10/26/2009, 10/14/2012, 11/26/2013, 09/16/2017, 09/17/2018   Influenza Whole 10/16/2016   Influenza-Unspecified 01/29/2021, 01/29/2021   PFIZER(Purple Top)SARS-COV-2 Vaccination 02/21/2019, 03/18/2019, 11/19/2019   Pneumococcal Conjugate-13 12/24/2017   Pneumococcal  Polysaccharide-23 04/28/2019   Tdap 11/26/2013   Zoster Recombinant(Shingrix) 04/07/2021, 07/27/2021   Zoster, Live 11/26/2013    TDAP status: Up to date  Flu Vaccine status: Due, Education has been provided regarding the importance of this vaccine. Advised may receive this vaccine at local  pharmacy or Health Dept. Aware to provide a copy of the vaccination record if obtained from local pharmacy or Health Dept. Verbalized acceptance and understanding.  Pneumococcal vaccine status: Up to date  Covid-19 vaccine status: Information provided on how to obtain vaccines.   Qualifies for Shingles Vaccine? Yes   Zostavax completed Yes   Shingrix Completed?: Yes  Screening Tests Health Maintenance  Topic Date Due   COVID-19 Vaccine (4 - 2023-24 season) 09/06/2022 (Originally 09/16/2021)   INFLUENZA VACCINE  04/16/2023 (Originally 08/17/2022)   DEXA SCAN  03/29/2023   Colonoscopy  08/23/2023   Medicare Annual Wellness (AWV)  09/06/2023   DTaP/Tdap/Td (2 - Td or Tdap) 11/27/2023   MAMMOGRAM  05/25/2024   Pneumonia Vaccine 60+ Years old  Completed   Hepatitis C Screening  Completed   Zoster Vaccines- Shingrix  Completed   HPV VACCINES  Aged Out    Health Maintenance  There are no preventive care reminders to display for this patient.   Colorectal cancer screening: Type of screening: Colonoscopy. Completed 08/23/18. Repeat every 5 years  Mammogram status: Completed 05/26/22. Repeat every year  Bone Density status: Completed 03/28/21. Results reflect: Bone density results: OSTEOPENIA. Repeat every 2 years.   Additional Screening:  Hepatitis C Screening:  Completed 02/28/22  Vision Screening: Recommended annual ophthalmology exams for early detection of glaucoma and other disorders of the eye. Is the patient up to date with their annual eye exam?  Yes  Who is the provider or what is the name of the office in which the patient attends annual eye exams? DR Dione Booze  If pt is not established with a provider, would they like to be referred to a provider to establish care? No .   Dental Screening: Recommended annual dental exams for proper oral hygiene   Community Resource Referral / Chronic Care Management: CRR required this visit?  No   CCM required this visit?  No     Plan:      I have personally reviewed and noted the following in the patient's chart:   Medical and social history Use of alcohol, tobacco or illicit drugs  Current medications and supplements including opioid prescriptions. Patient is not currently taking opioid prescriptions. Functional ability and status Nutritional status Physical activity Advanced directives List of other physicians Hospitalizations, surgeries, and ER visits in previous 12 months Vitals Screenings to include cognitive, depression, and falls Referrals and appointments  In addition, I have reviewed and discussed with patient certain preventive protocols, quality metrics, and best practice recommendations. A written personalized care plan for preventive services as well as general preventive health recommendations were provided to patient.     Marzella Schlein, LPN   1/61/0960   After Visit Summary: (MyChart) Due to this being a telephonic visit, the after visit summary with patients personalized plan was offered to patient via MyChart   Nurse Notes: none

## 2022-09-06 NOTE — Patient Instructions (Signed)
Ms. Akemon , Thank you for taking time to come for your Medicare Wellness Visit. I appreciate your ongoing commitment to your health goals. Please review the following plan we discussed and let me know if I can assist you in the future.   Referrals/Orders/Follow-Ups/Clinician Recommendations: continue to lose weight   This is a list of the screening recommended for you and due dates:  Health Maintenance  Topic Date Due   COVID-19 Vaccine (4 - 2023-24 season) 09/06/2022*   Flu Shot  04/16/2023*   DEXA scan (bone density measurement)  03/29/2023   Colon Cancer Screening  08/23/2023   Medicare Annual Wellness Visit  09/06/2023   DTaP/Tdap/Td vaccine (2 - Td or Tdap) 11/27/2023   Mammogram  05/25/2024   Pneumonia Vaccine  Completed   Hepatitis C Screening  Completed   Zoster (Shingles) Vaccine  Completed   HPV Vaccine  Aged Out  *Topic was postponed. The date shown is not the original due date.    Advanced directives: (Copy Requested) Please bring a copy of your health care power of attorney and living will to the office to be added to your chart at your convenience.  Next Medicare Annual Wellness Visit scheduled for next year: Yes

## 2022-10-17 ENCOUNTER — Telehealth: Payer: Self-pay | Admitting: Family

## 2022-10-17 MED ORDER — ATORVASTATIN CALCIUM 40 MG PO TABS: 40.0000 mg | ORAL_TABLET | Freq: Every day | ORAL | 3 refills | Status: DC

## 2022-10-17 NOTE — Telephone Encounter (Signed)
Last OV: 08/21/2022 Pending OV: Nothing scheduled at this time Medication Atorvastatin 40mg  Directions: Take one tablet daily Last Refill: Do not see anything on file from our office.

## 2022-10-17 NOTE — Telephone Encounter (Signed)
Prescription Request  10/17/2022  LOV: 08/21/2022  What is the name of the medication or equipment? atorvastatin (LIPITOR) 40 MG tablet  Have you contacted your pharmacy to request a refill? No   Which pharmacy would you like this sent to?   [facility] [address] - [place], [place] - [address] AT [facility] [address] [place] [address] Phone: [phone] Fax: [phone]    Patient notified that their request is being sent to the clinical staff for review and that they should receive a response within 2 business days.   Please advise at Mobile [phone] (mobile)

## 2022-10-24 DIAGNOSIS — D485 Neoplasm of uncertain behavior of skin: Secondary | ICD-10-CM | POA: Diagnosis not present

## 2022-10-24 DIAGNOSIS — C44521 Squamous cell carcinoma of skin of breast: Secondary | ICD-10-CM | POA: Diagnosis not present

## 2022-10-24 DIAGNOSIS — C44529 Squamous cell carcinoma of skin of other part of trunk: Secondary | ICD-10-CM | POA: Diagnosis not present

## 2022-10-30 NOTE — Progress Notes (Signed)
noted 

## 2022-11-08 ENCOUNTER — Encounter: Payer: Self-pay | Admitting: Family

## 2022-11-08 ENCOUNTER — Ambulatory Visit: Payer: Medicare HMO | Admitting: Family

## 2022-11-08 ENCOUNTER — Ambulatory Visit (INDEPENDENT_AMBULATORY_CARE_PROVIDER_SITE_OTHER)
Admission: RE | Admit: 2022-11-08 | Discharge: 2022-11-08 | Disposition: A | Discharge status: Home / Self Care | Payer: Medicare HMO | Source: Ambulatory Visit | Attending: Family

## 2022-11-08 VITALS — BP 122/80 | HR 59 | Temp 98.0°F | Ht 61.5 in | Wt 175.0 lb

## 2022-11-08 DIAGNOSIS — M79641 Pain in right hand: Secondary | ICD-10-CM | POA: Diagnosis not present

## 2022-11-08 DIAGNOSIS — M7989 Other specified soft tissue disorders: Secondary | ICD-10-CM | POA: Diagnosis not present

## 2022-11-08 DIAGNOSIS — Z23 Encounter for immunization: Secondary | ICD-10-CM | POA: Diagnosis not present

## 2022-11-08 DIAGNOSIS — M1811 Unilateral primary osteoarthritis of first carpometacarpal joint, right hand: Secondary | ICD-10-CM | POA: Diagnosis not present

## 2022-11-08 DIAGNOSIS — L03113 Cellulitis of right upper limb: Secondary | ICD-10-CM | POA: Insufficient documentation

## 2022-11-08 MED ORDER — CEPHALEXIN 500 MG PO CAPS: 500.0000 mg | ORAL_CAPSULE | Freq: Three times a day (TID) | ORAL | 0 refills | Status: AC

## 2022-11-08 MED ORDER — PREDNISONE 10 MG (21) PO TBPK: ORAL_TABLET | ORAL | 0 refills | Status: DC

## 2022-11-08 NOTE — Patient Instructions (Addendum)
Start with antibiotic   Sending in prednisone which you can use if no improvement. Be cautious as this cna increase bleeding risk for daily aspirin use.   Please monitor site for worsening signs/symptoms of infection to include: increasing redness, increasing tenderness, increase in size, and or pustulant drainage from site. If this is to occur please let me know immediately.

## 2022-11-08 NOTE — Progress Notes (Signed)
Established Patient Office Visit  Subjective:   Patient ID: Anna Hodges, female    DOB: 1950-02-02  Age: 72 y.o. MRN: [id number]  CC:  Chief Complaint  Patient presents with   Hand Pain    R hand pain, swelling x1 day. The hand is also itching and this is keeping her awake at night. Denies any injury that she is aware. Has not taken any OTC medications.    HPI: Anna Hodges is a 72 y.o. female presenting on 11/08/2022 for Hand Pain (R hand pain, swelling x1 day. The hand is also itching and this is keeping her awake at night. Denies any injury that she is aware. Has not taken any OTC medications.)  In the middle of the night Monday night she had an itchy hand and was scratching and then when she woke up it was swollen. She is curious if maybe she hit it somewhere while sleeping but doesn't otherwise remember trauma and or injury. No other arthralgias or rashes on her body.  Has not taken anything over the counter at this time.   Does have PSA has appt for consult with Dr. Dimple Casey, rheumatology tomorrow for evaluation. Not currently on medications for this at this time.       ROS: Negative unless specifically indicated above in HPI.   Relevant past medical history reviewed and updated as indicated.   Allergies and medications reviewed and updated.   Current Outpatient Medications:    aspirin EC 81 MG tablet, Take 81 mg by mouth daily., Disp: , Rfl:    atorvastatin (LIPITOR) 40 MG tablet, Take 1 tablet (40 mg total) by mouth daily., Disp: 90 tablet, Rfl: 3   cephALEXin (KEFLEX) 500 MG capsule, Take 1 capsule (500 mg total) by mouth 3 (three) times daily for 7 days., Disp: 21 capsule, Rfl: 0   cyclobenzaprine (FLEXERIL) 10 MG tablet, Take 1 tablet (10 mg total) by mouth daily as needed for muscle spasms., Disp: 30 tablet, Rfl: 0   FLUoxetine (PROZAC) 20 MG tablet, Take 1 tablet (20 mg total) by mouth daily., Disp: 90 tablet, Rfl: 0   nabumetone (RELAFEN) 500 MG  tablet, Take 500 mg by mouth 2 (two) times daily., Disp: , Rfl: 6   pantoprazole (PROTONIX) 40 MG tablet, Take 1 tablet (40 mg total) by mouth daily. Take 30-60 min before first meal of the day, Disp: 30 tablet, Rfl: 2   predniSONE (STERAPRED UNI-PAK 21 TAB) 10 MG (21) TBPK tablet, Take as directed (Patient not taking: Reported on 11/09/2022), Disp: 1 each, Rfl: 0   promethazine (PHENERGAN) 6.25 MG/5ML syrup, Take by mouth every 6 (six) hours as needed for nausea or vomiting., Disp: , Rfl:    triamcinolone cream (KENALOG) 0.1 %, Apply 1 Application topically 2 (two) times daily., Disp: , Rfl:   Allergies  Allergen Reactions   Codeine     REACTION: GI upset   Duloxetine Other (See Comments)    Didn't seem to work    Lamictal [Lamotrigine] Other (See Comments)    Didn't like how it made her feel   Pregabalin Other (See Comments)    Didn't work     Objective:   BP 122/80 (BP Location: Left Arm, Patient Position: Sitting, Cuff Size: Normal)   Pulse (!) 59   Temp 98 F (36.7 C) (Temporal)   Ht 5' 1.5" (1.562 m)   Wt 175 lb (79.4 kg)   SpO2 98%   BMI 32.53 kg/m    Physical  Exam Musculoskeletal:     Comments: Some wrist pain with extension  Swelling from base of right wrisk extending to base of metacarpals. Point tenderness to mid anterior hand. Some slight bruising to medial aspect of anterior hand. Decreased grip strength and pain with grip on right hand. Pain with flexion on anterior hand left side as well.   There is warmth, erythema as well throughout the hand and base of wrist.         Assessment & Plan:  Cellulitis of right upper extremity Assessment & Plan: Small area of erythema, hot to touch.  Rx cephalexin 500 mg tid x 7 days. Please monitor site for worsening signs/symptoms of infection to include: increasing redness, increasing tenderness, increase in size, and or pustulant drainage from site. If this is to occur please let me know immediately.    Orders: -      Cephalexin; Take 1 capsule (500 mg total) by mouth 3 (three) times daily for 7 days.  Dispense: 21 capsule; Refill: 0 -     predniSONE; Take as directed (Patient not taking: Reported on 11/09/2022)  Dispense: 1 each; Refill: 0  Right hand pain Assessment & Plan: With new swelling, xray right hand to r/o injury or trauma pending results.  Rx prednisone pack  Ice to site   Orders: -     DG Hand Complete Right; Future  Swelling of right hand -     DG Hand Complete Right; Future -     predniSONE; Take as directed (Patient not taking: Reported on 11/09/2022)  Dispense: 1 each; Refill: 0  Encounter for immunization -     Flu Vaccine Trivalent High Dose (Fluad)     Follow up plan: Return if symptoms worsen or fail to improve.  Mort Sawyers, FNP

## 2022-11-09 ENCOUNTER — Ambulatory Visit: Payer: Medicare HMO | Attending: Internal Medicine | Admitting: Internal Medicine

## 2022-11-09 ENCOUNTER — Encounter: Payer: Self-pay | Admitting: Internal Medicine

## 2022-11-09 VITALS — BP 148/77 | HR 60 | Resp 15 | Ht 61.5 in | Wt 175.0 lb

## 2022-11-09 DIAGNOSIS — M4800 Spinal stenosis, site unspecified: Secondary | ICD-10-CM | POA: Diagnosis not present

## 2022-11-09 DIAGNOSIS — L4052 Psoriatic arthritis mutilans: Secondary | ICD-10-CM

## 2022-11-09 NOTE — Progress Notes (Signed)
Office Visit Note  Patient: Anna Hodges             Date of Birth: [birth year]           MRN: [id number]             PCP: Mort Sawyers, FNP Referring: Mort Sawyers, FNP Visit Date: 11/09/2022  Subjective:  New Patient (Initial Visit) (Psoriatic Arthritis? )   History of Present Illness: Anna Hodges is a 72 y.o. female here for evaluation of chronic joint pain in multiple areas concern for possible psoriatic arthritis.  She reports original suspicion of inflammatory arthritis from her orthopedist due to shoulder inflammation and advanced joint degeneration.  Ultimately led to right total shoulder replacement with good result.  Also had chronic joint pain and stiffness in multiple areas saw Dr. Zenovia Jordan for evaluation and was treated with a few different medicines.  From what she can recall sounds like Humira and Otezla not sure about any other specific DMARD.  She never had significant skin rash involvement.  Keeps very dry skin and has had several squamous cell cancers.  Does have some erythematous patch on the scalp this is not itchy and no associated scaling does not use any local treatments.  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  In particular problem today she abruptly developed redness pain and swelling on the back of the right hand with associated stiffness.  She went for x-ray of the hand yesterday this just showed mild degenerative changes she was prescribed oral Keflex since starting this the swelling has partially improved.  Also with left hand pain localized to the third digit describes occasionally getting stuck in a flexed position with difficulty fully extending.  No particular complaint of any increased pain in her knees or feet or ankles.    Activities of Daily Living:  Patient reports morning stiffness for 0  none .   Patient Reports nocturnal  pain.  Difficulty dressing/grooming: Denies Difficulty climbing stairs: Denies Difficulty getting out of chair: Denies Difficulty using hands for taps, buttons, cutlery, and/or writing: Denies  Review of Systems  Constitutional:  Negative for fatigue.  HENT:  Positive for mouth dryness. Negative for mouth sores.   Eyes:  Negative for dryness.  Respiratory:  Negative for shortness of breath.   Cardiovascular:  Negative for chest pain and palpitations.  Gastrointestinal:  Negative for blood in stool, constipation and diarrhea.  Endocrine: Negative for increased urination.  Genitourinary:  Negative for involuntary urination.  Musculoskeletal:  Positive for joint pain, joint pain, joint swelling, myalgias, muscle weakness, muscle tenderness and myalgias. Negative for gait problem and morning stiffness.  Skin:  Positive for color change, rash, hair loss, nodules/bumps and redness. Negative for sensitivity to sunlight.  Allergic/Immunologic: Negative for susceptible to infections.  Neurological:  Positive for dizziness and headaches.  Hematological:  Negative for swollen glands.  Psychiatric/Behavioral:  Positive for sleep disturbance. Negative for depressed mood. The patient is not nervous/anxious.     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 Pearlean Brownie): suggestive of small right to left intracardiac shunt. He felt unlikely to be of clinical significance. Rx ASA.   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    Squamous cell carcinoma in situ (SCCIS) of skin of right lower leg     Family History  Problem Relation Age of Onset   Hypertension Mother    Hyperlipidemia Mother    Hypertension Father    Hyperlipidemia Father    Bone cancer Sister    Bipolar disorder Son    Bipolar disorder Granddaughter    Bipolar disorder Granddaughter    Breast cancer Neg Hx    Past Surgical History:  Procedure Laterality Date   BACK SURGERY     1 lumbar   BREAST SURGERY     bilateral implants   CERVICAL SPINE SURGERY     COSMETIC SURGERY     EYE SURGERY  2033   JOINT REPLACEMENT  01/22/2015   SPINE SURGERY     TOTAL SHOULDER ARTHROPLASTY Left 01/22/2015   Procedure: LEFT TOTAL SHOULDER ARTHROPLASTY;  Surgeon: Beverely Low, MD;  Location: [facility] OR;  Service: Orthopedics;  Laterality: Left;   VAGINAL HYSTERECTOMY     still with ovaries   Social History   Social History Narrative   Not on file   Immunization History  Administered Date(s) Administered   Fluad Trivalent(High Dose 65+) 11/08/2022   Hepatitis B, PED/ADOLESCENT 04/02/1998, 05/21/1998, 11/12/1998   Influenza Split 10/26/2009, 10/14/2012, 11/26/2013, 09/16/2017, 09/17/2018   Influenza Whole 10/16/2016   Influenza-Unspecified 01/29/2021, 01/29/2021   PFIZER(Purple Top)SARS-COV-2 Vaccination 02/21/2019, 03/18/2019, 11/19/2019   Pneumococcal Conjugate-13 12/24/2017   Pneumococcal Polysaccharide-23 04/28/2019   Tdap 11/26/2013   Zoster Recombinant(Shingrix) 04/07/2021, 07/27/2021   Zoster, Live 11/26/2013     Objective: Vital Signs: BP (!) 148/77 (BP Location: Right Arm, Patient Position: Sitting, Cuff Size: Normal)   Pulse 60   Resp 15   Ht 5' 1.5"  (1.562 m)   Wt 175 lb (79.4 kg)   BMI 32.53 kg/m    Physical Exam Eyes:     Conjunctiva/sclera: Conjunctivae normal.  Cardiovascular:     Rate and Rhythm: Normal rate and regular rhythm.  Pulmonary:     Effort: Pulmonary effort is normal.     Breath sounds: Normal breath sounds.  Musculoskeletal:     Right lower leg: No edema.     Left lower leg: No edema.  Lymphadenopathy:     Cervical: No cervical adenopathy.  Skin:    General: Skin is warm and dry.     Findings: Rash present.     Comments: Flat erythematous patch at the occiput with no overlying scale No digital pitting no nail discoloration  Neurological:     Mental Status: She is alert.  Psychiatric:        Mood and Affect: Mood normal.      Musculoskeletal Exam:  Shoulders left side is slightly restricted in abduction and external rotation but no focal tenderness, no palpable effusions Elbows full ROM no tenderness or swelling Wrists full ROM no tenderness or swelling Mild swelling  is present on the dorsal side of the right hand at the wrist and extending part way towards the MCP joints, no palpable joint swelling, there is some pain provoked with full flexion of the fingers, left hand third digit pain around MCP and PIP area with full flexion no palpable flexor tendon nodule or joint effusion Normal hip internal/external rotation range of motion, mild tenderness to pressure laterally Knees full ROM, bilateral patellofemoral crepitus, no palpable effusions Ankles full ROM no tenderness or swelling   Investigation: No additional findings.  Imaging: DG Hand Complete Right  Result Date: 11/08/2022 CLINICAL DATA:  Right hand swelling and pain with movement. EXAM: RIGHT HAND - COMPLETE 3+ VIEW COMPARISON:  Bilateral hand radiographs 06/23/2011 FINDINGS: There is mildly decreased bone mineralization, new from prior remote radiographs. Mild second through fifth DIP joint space narrowing. Mild thumb carpometacarpal joint  space narrowing and subchondral sclerosis with a small 2 mm calcific versus ossific density at the lateral aspect of the joint. No acute fracture or dislocation. No cortical erosion or periostitis. IMPRESSION: 1. Mild thumb carpometacarpal osteoarthritis. 2. Very mild second through fifth DIP osteoarthritis. Electronically Signed   By: Neita Garnet M.D.   On: 11/08/2022 14:37    Recent Labs: Lab Results  Component Value Date   WBC 7.0 10/17/2016   HGB 14.4 10/17/2016   PLT 212 10/17/2016   NA 140 02/28/2022   K 4.2 02/28/2022   CL 105 02/28/2022   CO2 26 02/28/2022   GLUCOSE 90 02/28/2022   BUN 13 02/28/2022   CREATININE 0.53 02/28/2022   BILITOT 0.5 02/28/2022   ALKPHOS 72 10/17/2016   AST 20 02/28/2022   ALT 22 02/28/2022   PROT 7.0 02/28/2022   ALBUMIN 4.0 10/17/2016   CALCIUM 9.6 02/28/2022   GFRAA >60 10/17/2016    Speciality Comments: No specialty comments available.  Procedures:  No procedures performed Allergies: Codeine, Duloxetine, Lamictal [lamotrigine], and Pregabalin   Assessment / Plan:     Visit Diagnoses: Psoriatic arthritis mutilans (HCC)  Discussed psoriasis and psoriatic arthritis.  I do not see any definite synovitis dactylitis or enthesitis and evidence on exam today.  Skin disease appears well-controlled has topical steroid for as needed treatment.  Discussed her current maintenance NSAIDs may be suppressing low-grade inflammation, I do not see any findings highly concerning for erosive joint disease.  Limited prior response to [place] or Humira may also indicate symptoms less related to PsA.  Okay to monitor on the current regimen discussed following back up if she starts to see any increase in peripheral joint swelling or skin disease could be candidate for trying systemic therapy through alternate mechanism of action.  Spinal stenosis, unspecified spinal region  Has known significant degenerative arthritis at multiple levels through the spine.  Currently  Relafen 500 mg twice daily and Flexeril 10 mg as needed. Agree with continued pain management or orthopedics f/u management.   Orders: No orders of the defined types were placed in this encounter.  No orders of the defined types were placed in this encounter.    Follow-Up Instructions: Return if symptoms worsen or fail to improve.   Fuller Plan, MD  Note - This record has been created using [employer].  Chart creation errors have been sought, but may not always  have been located. Such creation errors do not reflect on  the standard of medical care.

## 2022-11-09 NOTE — Patient Instructions (Signed)
I do not see any signs concern for active psoriasis or psoriatic arthritis at this time. The nabumetone (relafen) medication is an antiinflammatory medication so may be treating this if it is a mild disease.  We can follow up as needed if you see increase in rashes or swelling starts to come back repeatedly or last for more than a week or two.  I also attached some information about arthritis and the trigger finger. For osteoarthritis several treatments may be beneficial:  - Topical antiinflammatory medicine such as diclofenac or Voltaren can be applied to  affected area as needed. Topical analgesics containing CBD, menthol, or lidocaine can be tried.  - Turmeric has some antiinflammatory effect similar to NSAIDs and may help, if taken as a supplement should not be taken above recommended doses.   - Compressive gloves or sleeve can be helpful to support the joint especially if hurting or swelling with certain activities.  - Local steroid injection is an option if symptoms become worse and not controlled by the above options.

## 2022-11-13 NOTE — Assessment & Plan Note (Signed)
Small area of erythema, hot to touch.  Rx cephalexin 500 mg tid x 7 days. Please monitor site for worsening signs/symptoms of infection to include: increasing redness, increasing tenderness, increase in size, and or pustulant drainage from site. If this is to occur please let me know immediately.

## 2022-11-13 NOTE — Assessment & Plan Note (Signed)
With new swelling, xray right hand to r/o injury or trauma pending results.  Rx prednisone pack  Ice to site

## 2022-11-22 ENCOUNTER — Other Ambulatory Visit: Payer: Self-pay | Admitting: Family

## 2022-11-22 DIAGNOSIS — F419 Anxiety disorder, unspecified: Secondary | ICD-10-CM

## 2022-11-22 DIAGNOSIS — F39 Unspecified mood [affective] disorder: Secondary | ICD-10-CM

## 2022-12-04 ENCOUNTER — Other Ambulatory Visit: Payer: Self-pay | Admitting: Family

## 2023-01-22 ENCOUNTER — Ambulatory Visit (INDEPENDENT_AMBULATORY_CARE_PROVIDER_SITE_OTHER): Payer: Medicare Other | Admitting: Internal Medicine

## 2023-01-22 ENCOUNTER — Ambulatory Visit: Payer: Self-pay | Admitting: Family

## 2023-01-22 ENCOUNTER — Ambulatory Visit (INDEPENDENT_AMBULATORY_CARE_PROVIDER_SITE_OTHER)
Admission: RE | Admit: 2023-01-22 | Discharge: 2023-01-22 | Disposition: A | Discharge status: Home / Self Care | Payer: Medicare Other | Source: Ambulatory Visit | Attending: Internal Medicine | Admitting: Internal Medicine

## 2023-01-22 VITALS — BP 132/84 | HR 65 | Temp 98.3°F | Ht 61.5 in | Wt 176.0 lb

## 2023-01-22 DIAGNOSIS — M79602 Pain in left arm: Secondary | ICD-10-CM | POA: Diagnosis not present

## 2023-01-22 NOTE — Telephone Encounter (Signed)
   Chief Complaint: fell 1/2 Symptoms: bruised, swollen, painful on Left arm Frequency: twice  Disposition: [] ED /[] Urgent Care (no appt availability in office) / [x] Appointment(In office/virtual)/ []  [place] [facility]/ [] Home Care/ [] Refused Recommended Disposition /[] [place] [facility]/ []  Follow-up with PCP Additional Notes: Pt fell twice on 1/2. PT was traveling to [facility] her sister's funeral. PT stated while rearranging her suitcase she leaned over and lost her balance and hit head on wall. Blood did not gush, but later pt felt dried blood on scalp. On same day, pt in bathroom and lost balance again. This time pt hit shower bar. Pt stated her left arm is swollen and very painful to touch. Pt has  black eye and bruising throughout body.  Pt denies dizziness and feels she just lost balance due to being emotional over death of sister. Per protocol, pt to be seen within 4 hours. Pt has appt with provider at 1500. Pt understood all care advice.         Copied from CRM [id number]. Topic: Clinical - Red Word Triage >> Jan 22, 2023  9:11 AM Eleanor C wrote: [facility] that prompted transfer to Nurse Triage: patient was in [place]  on Thursday and fell, has a black eye, split head, and hurt left arm and wrist Reason for Disposition  [1] MODERATE weakness (i.e., interferes with work, school, normal activities) AND [2] new-onset or worsening  Answer Assessment - Initial Assessment Questions 1. MECHANISM: How did the fall happen?     2x-Leaned over to grab suitcase and hit head that caused bleeding. Not much  and second fall in bathroom and lost balance  and hit shower bar. 2. DOMESTIC VIOLENCE AND ELDER ABUSE SCREENING: Did you fall because someone pushed you or tried to hurt you? If Yes, ask: Are you safe now?     na 3. ONSET: When did the fall happen? (e.g., minutes, hours, or days ago)     01/18/23 4. LOCATION: What part of the body hit the ground? (e.g., back, buttocks,  head, hips, knees, hands, head, stomach)    Whole body hit between both falls.  5. INJURY: Did you hurt (injure) yourself when you fell? If Yes, ask: What did you injure? Tell me more about this? (e.g., body area; type of injury; pain severity)     Yes, both arms, head, black eye, legs 6. PAIN: Is there any pain? If Yes, ask: How bad is the pain? (e.g., Scale 1-10; or mild,  moderate, severe)   - NONE (0): No pain   - MILD (1-3): Doesn't interfere with normal activities    - MODERATE (4-7): Interferes with normal activities or awakens from sleep    - SEVERE (8-10): Excruciating pain, unable to do any normal activities      8 7. SIZE: For cuts, bruises, or swelling, ask: How large is it? (e.g., inches or centimeters)      Cuts, bruises, swollen (left arm)  9. OTHER SYMPTOMS: Do you have any other symptoms? (e.g., dizziness, fever, weakness; new onset or worsening).      denies 10. CAUSE: What do you think caused the fall (or falling)? (e.g., tripped, dizzy spell)       Lost balance  Protocols used: Falls and [facility]

## 2023-01-22 NOTE — Telephone Encounter (Signed)
 FYI- Looks like patient has appt this afternoon in the office. Sending to [facility]vak and dugal pool

## 2023-01-22 NOTE — Telephone Encounter (Signed)
 I will check her at the appointment today

## 2023-01-22 NOTE — Progress Notes (Signed)
 Subjective:    Patient ID: Anna Hodges, female    DOB: 1950/01/29, 73 y.o.   MRN: [id number]  HPI Here after a fall 4 days ago  [place] to [place]  for sister's funeral Reached down for suitcase---fell forward and hit head on wall. Didn't realize she broke open the skin on head Later that day (still in hotel perhaps 30 minutes later), lost balance in bathroom---fell forward and hit left and right arms, left knee and face  Got up on her own Sat down--having pain in head and face Didn't notice blackening in eye till the next day (but had seen bruising above lateral left eye) Did make it to funeral the next day  Came home 2 days ago Trouble using right thumb and sore in both forearms Tender on top of head and by left eye Knee is bruised  Tried to go to 3 different urgent cares---- wasn't able to be seen   Hasn't taken any medications--other than regular nabumetone   Current Outpatient Medications on File Prior to Visit  Medication Sig Dispense Refill   aspirin  EC 81 MG tablet Take 81 mg by mouth daily.     atorvastatin  (LIPITOR) 40 MG tablet Take 1 tablet (40 mg total) by mouth daily. 90 tablet 3   cyclobenzaprine  (FLEXERIL ) 10 MG tablet Take 1 tablet (10 mg total) by mouth daily as needed for muscle spasms. 30 tablet 0   FLUoxetine  (PROZAC ) 20 MG tablet TAKE ONE TABLET BY MOUTH ONE TIME DAILY 90 tablet 0   nabumetone  (RELAFEN ) 500 MG tablet Take 500 mg by mouth 2 (two) times daily.  6   pantoprazole  (PROTONIX ) 40 MG tablet TAKE ONE TABLET BY MOUTH EVERY MORNING 90 tablet 3   promethazine (PHENERGAN) 6.25 MG/5ML syrup Take by mouth every 6 (six) hours as needed for nausea or vomiting.     triamcinolone  cream (KENALOG ) 0.1 % Apply 1 Application topically 2 (two) times daily.     No current facility-administered medications on file prior to visit.    Allergies  Allergen Reactions   Codeine     REACTION: GI upset   Duloxetine Other (See Comments)    Didn't seem to  work    Lamictal [Lamotrigine] Other (See Comments)    Didn't like how it made her feel   Pregabalin Other (See Comments)    Didn't work     Past Medical History:  Diagnosis Date   Allergy    Anxiety    Arthritis    mainly in all her joints   GERD (gastroesophageal reflux disease)    PFO (patent foramen ovale)    01/05/10: transcranial doppler bubble study (Dr. Rosemarie): suggestive of small right to left intracardiac shunt. He felt unlikely to be of clinical significance. Rx ASA.   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    Squamous cell carcinoma in situ (SCCIS) of skin of right lower leg     Past Surgical History:  Procedure Laterality Date   BACK SURGERY     1 lumbar   BREAST SURGERY     bilateral implants   CERVICAL SPINE SURGERY     COSMETIC SURGERY     EYE SURGERY  2033   JOINT REPLACEMENT  01/22/2015   SPINE SURGERY     TOTAL SHOULDER ARTHROPLASTY Left 01/22/2015   Procedure: LEFT TOTAL SHOULDER ARTHROPLASTY;  Surgeon: Marcey Her, MD;  Location: [facility]  OR;  Service: Orthopedics;  Laterality: Left;   VAGINAL HYSTERECTOMY     still with ovaries    Family History  Problem Relation Age of Onset   Hypertension Mother    Hyperlipidemia Mother    Hypertension Father    Hyperlipidemia Father    Bone cancer Sister    Bipolar disorder Son    Bipolar disorder Granddaughter    Bipolar disorder Granddaughter    Breast cancer Neg Hx     Social History   Socioeconomic History   Marital status: Divorced    Spouse name: Not on file   Number of children: Not on file   Years of education: Not on file   Highest education level: GED or equivalent  Occupational History   Occupation: retired  Tobacco Use   Smoking status: Former    Current packs/day: 0.00    Average packs/day: 0.3 packs/day for 6.0 years (1.5 ttl pk-yrs)    Types: Cigarettes    Start date: 01/17/1991    Quit date: 01/16/1997     Years since quitting: 26.0    Passive exposure: Current   Smokeless tobacco: Never  Vaping Use   Vaping status: Never Used  Substance and Sexual Activity   Alcohol use: No    Alcohol/week: 3.0 standard drinks of alcohol    Types: 3 Glasses of wine per week   Drug use: No   Sexual activity: Not Currently    Partners: Male    Birth control/protection: Surgical  Other Topics Concern   Not on file  Social History Narrative   Not on file   Social Drivers of Health   Financial Resource Strain: Low Risk  (01/22/2023)   Overall Financial Resource Strain (CARDIA)    Difficulty of Paying Living Expenses: Not hard at all  Food Insecurity: No Food Insecurity (01/22/2023)   Hunger Vital Sign    Worried About Running Out of Food in the Last Year: Never true    Ran Out of Food in the Last Year: Never true  Transportation Needs: No Transportation Needs (01/22/2023)   PRAPARE - Administrator, Civil Service (Medical): No    Lack of Transportation (Non-Medical): No  Physical Activity: Inactive (01/22/2023)   Exercise Vital Sign    Days of Exercise per Week: 0 days    Minutes of Exercise per Session: 100 min  Stress: No Stress Concern Present (01/22/2023)   [employer] of Occupational Health - Occupational Stress Questionnaire    Feeling of Stress : Not at all  Social Connections: Unknown (01/22/2023)   Social Connection and Isolation Panel [NHANES]    Frequency of Communication with Friends and Family: More than three times a week    Frequency of Social Gatherings with Friends and Family: More than three times a week    Attends Religious Services: Patient declined    Database Administrator or Organizations: No    Attends Engineer, Structural: Not on file    Marital Status: Divorced  Intimate Partner Violence: Not At Risk (09/06/2022)   Humiliation, Afraid, Rape, and Kick questionnaire    Fear of Current or Ex-Partner: No    Emotionally Abused: No    Physically Abused:  No    Sexually Abused: No   Review of Systems No help needed with ADLs Intermittent headache No vision changes Sleeping okay---never great (but no change) Appetite is okay     Objective:   Physical Exam Constitutional:      Appearance: Normal  appearance.  HENT:     Head:     Comments: Bruising above and lateral to left eye Point tenderness over lateral orbit--but no obvious bony abnormalities  Small abrasion and tenderness on vertex Eyes:     Extraocular Movements: Extraocular movements intact.     Pupils: Pupils are equal, round, and reactive to light.  Musculoskeletal:     Comments: Left knee--minor contusion and tenderness over lateral distal patella-----normal ROM and no effusion  Bruising over right forearm and some at thumb. Mild tenderness but normal ROM  Left forearm has increased swelling and more tenderness along mid ulna shaft  Neurological:     Mental Status: She is alert.            Assessment & Plan:

## 2023-01-22 NOTE — Assessment & Plan Note (Addendum)
 After fall and bruising Multiple areas of contusions but only concern for fracture is left forearm Will check x-ray  X-ray looks negative to me---sent her out since not sure how long to get the stat report Will send to ortho if the radiologist finds something on it

## 2023-02-24 ENCOUNTER — Other Ambulatory Visit: Payer: Self-pay | Admitting: Family

## 2023-02-24 DIAGNOSIS — F39 Unspecified mood [affective] disorder: Secondary | ICD-10-CM

## 2023-02-24 DIAGNOSIS — F419 Anxiety disorder, unspecified: Secondary | ICD-10-CM

## 2023-03-01 ENCOUNTER — Telehealth: Payer: Self-pay

## 2023-03-01 NOTE — Addendum Note (Signed)
Addended by: Jaynee Eagles C on: 03/01/2023 11:04 AM   Modules accepted: Orders

## 2023-03-01 NOTE — Telephone Encounter (Signed)
Copied from CRM [id number]. Topic: General - Other >> Mar 01, 2023 10:39 AM Deaijah H wrote: Reason for CRM: patient called in to return lindsay call she prefers "[facility] [address] [facility] - [place], [place] - [address] AT [facility] [address] [facility] [address] [place] [address] Phone: [phone] Fax: [phone]"

## 2023-03-01 NOTE — Telephone Encounter (Signed)
Copied from CRM [id number]. Topic: Clinical - Prescription Issue >> Mar 01, 2023 10:13 AM Elizebeth Brooking wrote: Reason for CRM: Patient called in stated that her pharmacy informed her that her prescription  nabumetone (RELAFEN) 500 MG tablet needs authorization before they can release it to her

## 2023-03-01 NOTE — Telephone Encounter (Signed)
LM for pt to return call.  Need to know which pharmacy she prefers.

## 2023-03-02 NOTE — Telephone Encounter (Signed)
Rx was denied by [place]. Refusal reason >> provider not at this office.  Does the pt need to contact another provider about this refill?

## 2023-03-02 NOTE — Telephone Encounter (Signed)
Copied from CRM [id number]. Topic: Clinical - Prescription Issue >> Mar 02, 2023 11:47 AM Fuller Mandril wrote: Reason for CRM: Patient called to check status of prescription refill request. Advised in process. Patient understood. States she has 3 left. Thank You

## 2023-03-04 NOTE — Telephone Encounter (Signed)
I have never RX her for relafon it's typically given for autoimmune arthritis, does she get this from Dr. Dimple Casey?

## 2023-03-05 ENCOUNTER — Encounter: Payer: Self-pay | Admitting: Family

## 2023-03-05 DIAGNOSIS — L4052 Psoriatic arthritis mutilans: Secondary | ICD-10-CM

## 2023-03-05 MED ORDER — NABUMETONE 500 MG PO TABS: 500.0000 mg | ORAL_TABLET | Freq: Two times a day (BID) | ORAL | 0 refills | Status: DC

## 2023-03-05 NOTE — Telephone Encounter (Signed)
Spoke with Anna Hodges. States that Dr. Renato Gails was the one giving this to her but he retired 2 years ago. Anna Hodges would like to know if [name] would be willing to refill.

## 2023-05-16 DIAGNOSIS — Z85828 Personal history of other malignant neoplasm of skin: Secondary | ICD-10-CM | POA: Diagnosis not present

## 2023-05-16 DIAGNOSIS — L812 Freckles: Secondary | ICD-10-CM | POA: Diagnosis not present

## 2023-05-16 DIAGNOSIS — L821 Other seborrheic keratosis: Secondary | ICD-10-CM | POA: Diagnosis not present

## 2023-05-23 IMAGING — MG DIGITAL SCREENING BREAST BILAT IMPLANT W/ TOMO W/ CAD
8 of 12 series · 8 of 28 positions shown · non-contrast
Comparison: Previous exam(s).

CLINICAL DATA: Screening.

EXAM:
DIGITAL SCREENING BILATERAL MAMMOGRAM WITH IMPLANTS, CAD AND
TOMOSYNTHESIS
TECHNIQUE: Bilateral screening digital craniocaudal and mediolateral oblique
mammograms were obtained. Bilateral screening digital breast
tomosynthesis was performed. The images were evaluated with
computer-aided detection. Standard and/or implant displaced views
were performed.

[L MLO]
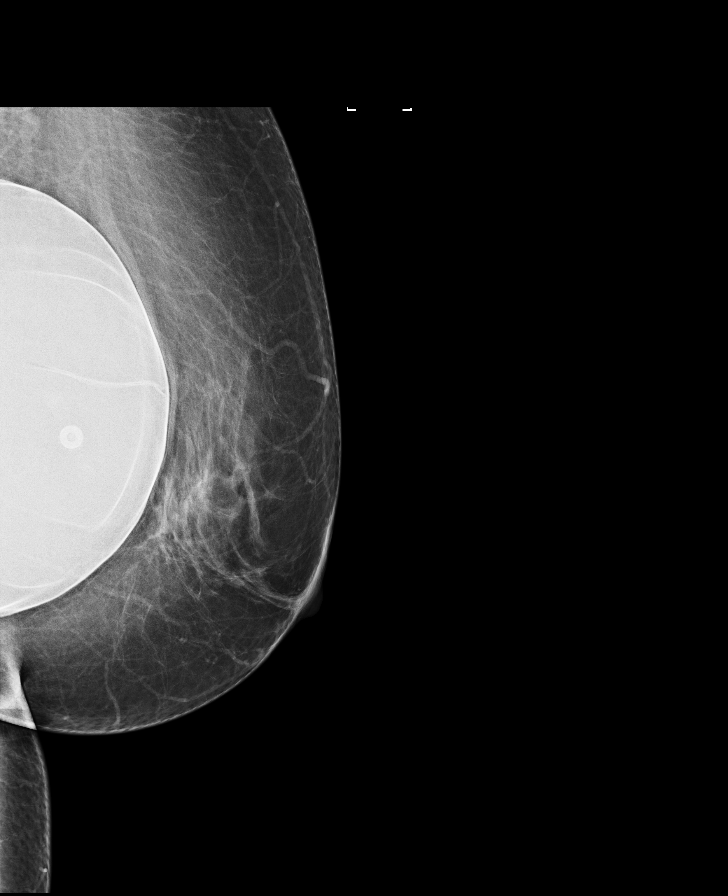

[R CC]
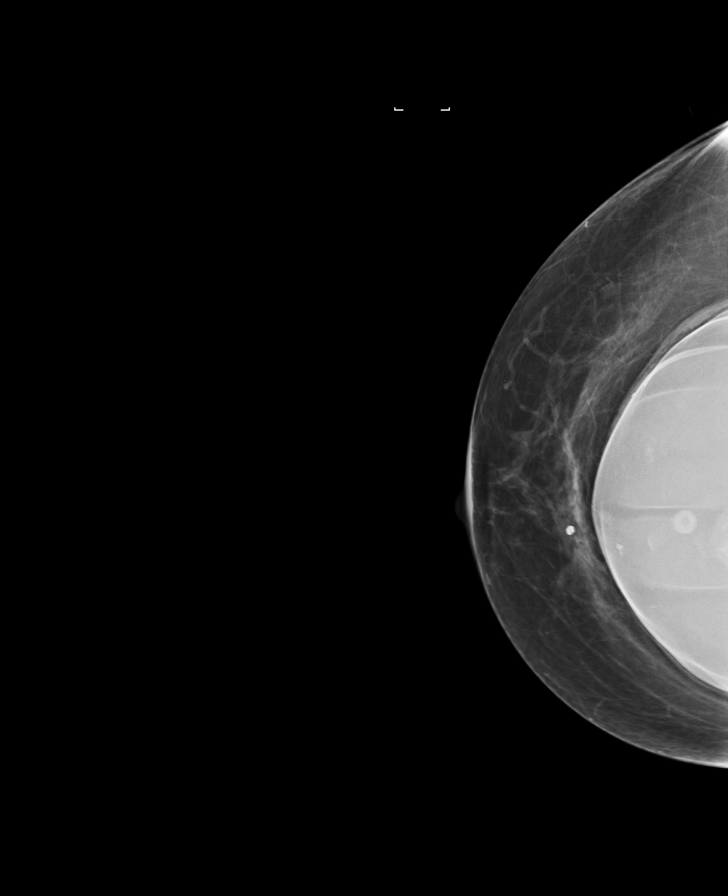

[L CC]
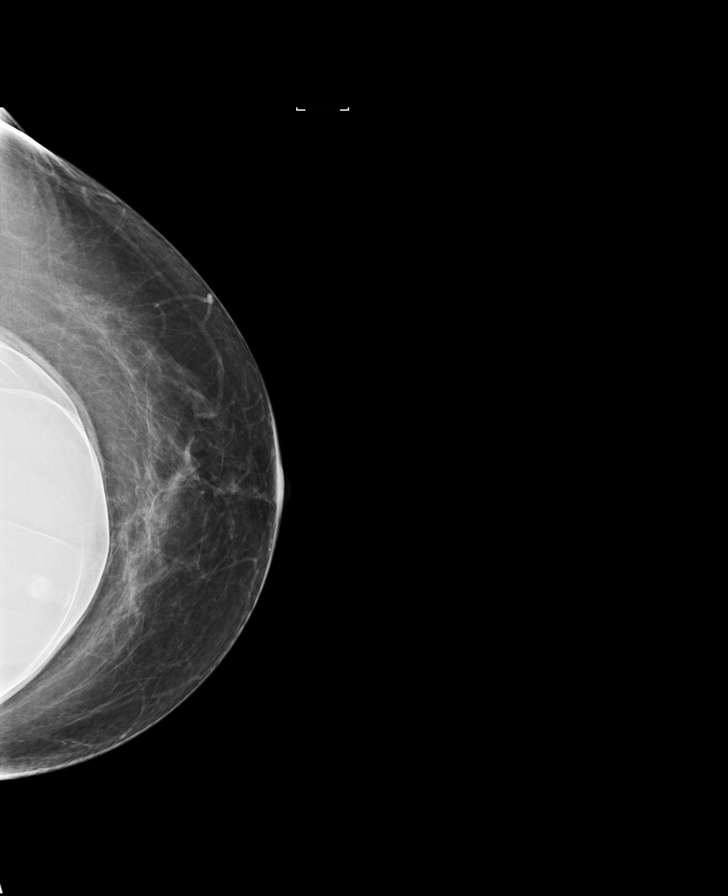

[R MLO]
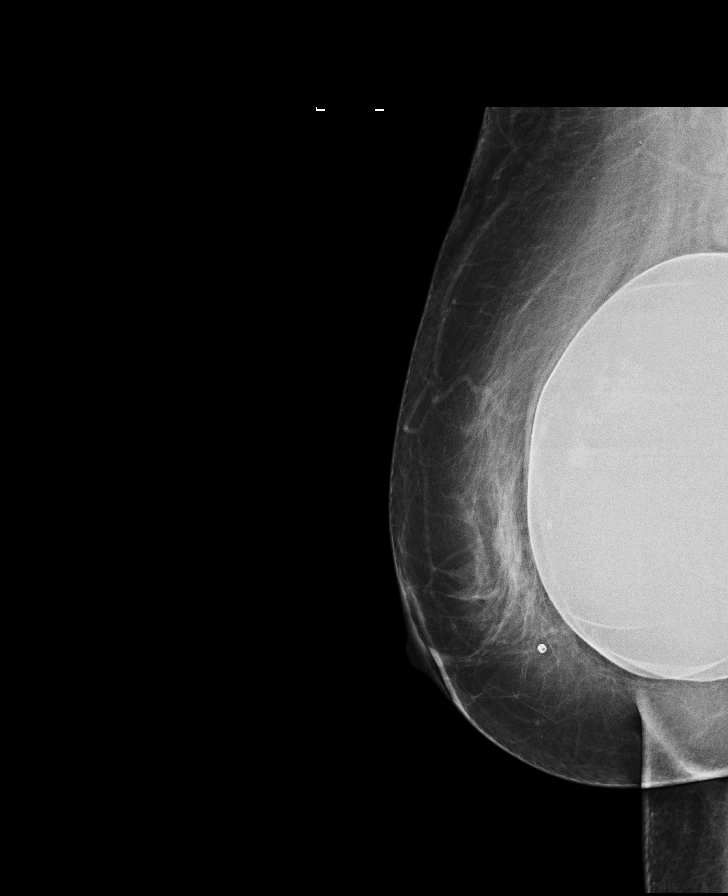

[L MLO synth-2D]
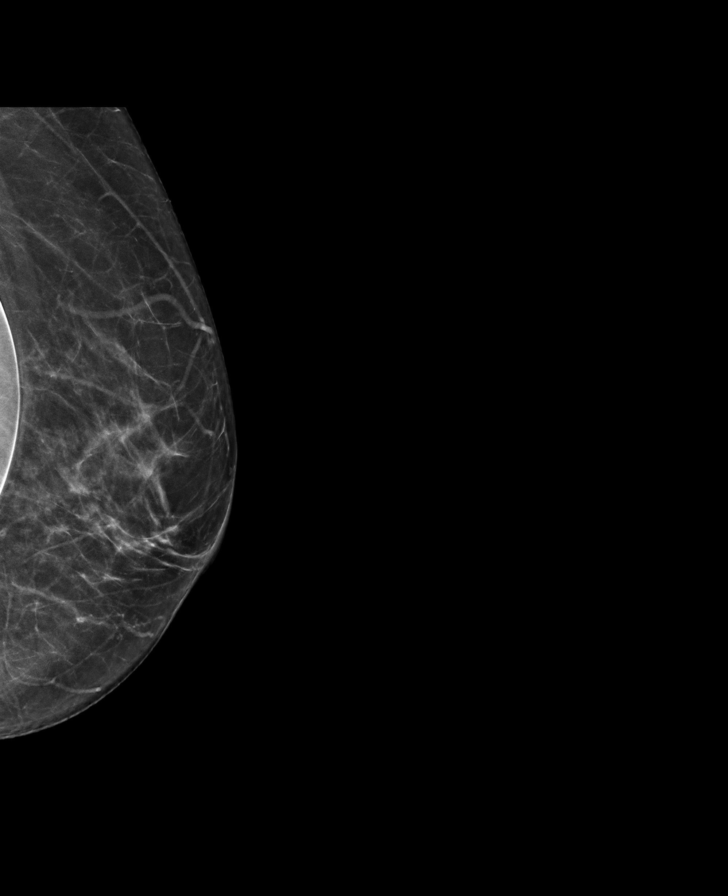

[R MLO synth-2D]
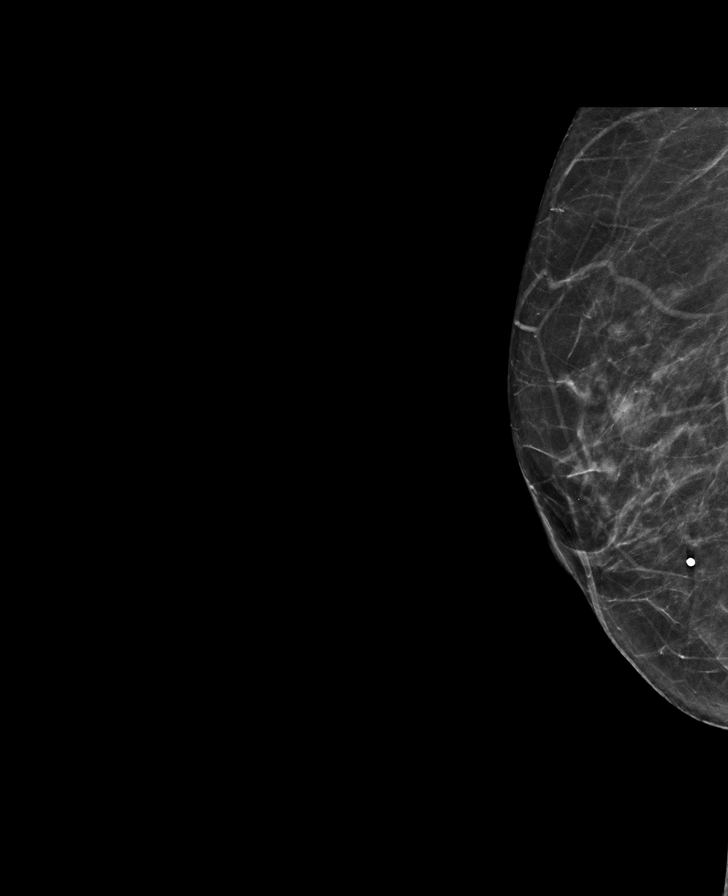

[R CC synth-2D]
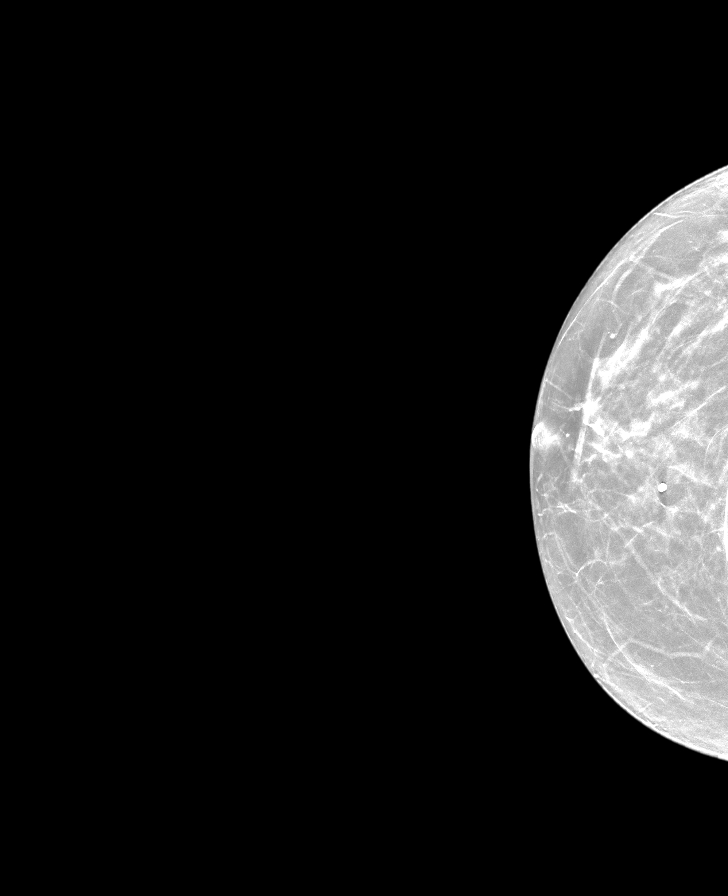

[L CC synth-2D]
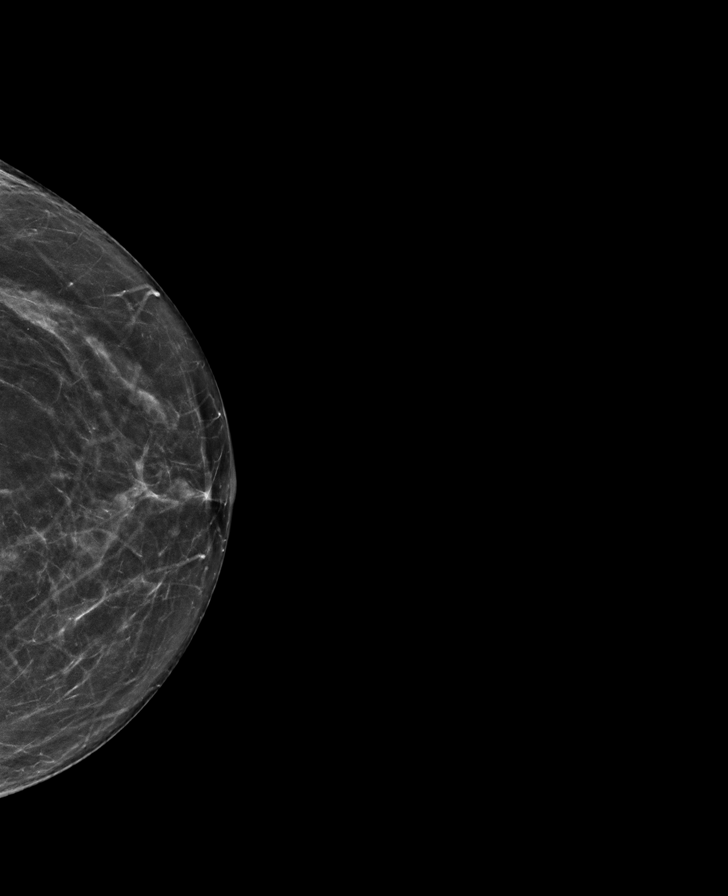

[8 of 28 positions shown; findings below may reference images not displayed]

ACR Breast Density Category b: There are scattered areas of
fibroglandular density.
FINDINGS: The patient has retropectoral implants. There are no findings
suspicious for malignancy.
IMPRESSION: No mammographic evidence of malignancy. A result letter of this
screening mammogram will be mailed directly to the patient.

RECOMMENDATION:
Screening mammogram in one year. (Code:SE-S-JMG)

BI-RADS CATEGORY  1:  Negative.

## 2023-06-05 ENCOUNTER — Other Ambulatory Visit: Payer: Self-pay | Admitting: Primary Care

## 2023-06-05 DIAGNOSIS — F419 Anxiety disorder, unspecified: Secondary | ICD-10-CM

## 2023-06-05 DIAGNOSIS — F39 Unspecified mood [affective] disorder: Secondary | ICD-10-CM

## 2023-06-05 NOTE — Telephone Encounter (Signed)
 Spoke to pt, scheduled f/u for 06/14/23

## 2023-06-09 ENCOUNTER — Other Ambulatory Visit: Payer: Self-pay | Admitting: Medical Genetics

## 2023-06-14 ENCOUNTER — Ambulatory Visit: Admitting: Family

## 2023-06-19 ENCOUNTER — Encounter: Payer: Self-pay | Admitting: Family

## 2023-06-19 ENCOUNTER — Ambulatory Visit (INDEPENDENT_AMBULATORY_CARE_PROVIDER_SITE_OTHER): Admitting: Family

## 2023-06-19 ENCOUNTER — Ambulatory Visit (INDEPENDENT_AMBULATORY_CARE_PROVIDER_SITE_OTHER)
Admission: RE | Admit: 2023-06-19 | Discharge: 2023-06-19 | Disposition: A | Discharge status: Home / Self Care | Source: Ambulatory Visit | Attending: Family | Admitting: Family

## 2023-06-19 VITALS — BP 132/86 | HR 63 | Temp 98.4°F | Ht 61.5 in | Wt 179.0 lb

## 2023-06-19 DIAGNOSIS — S6991XA Unspecified injury of right wrist, hand and finger(s), initial encounter: Secondary | ICD-10-CM | POA: Diagnosis not present

## 2023-06-19 DIAGNOSIS — F39 Unspecified mood [affective] disorder: Secondary | ICD-10-CM

## 2023-06-19 DIAGNOSIS — F419 Anxiety disorder, unspecified: Secondary | ICD-10-CM

## 2023-06-19 NOTE — Assessment & Plan Note (Signed)
 Continue

## 2023-06-19 NOTE — Progress Notes (Unsigned)
 Established Patient Office Visit  Subjective:   Patient ID: Anna Hodges, female    DOB: [birth year]  Age: 73 y.o. MRN: [id number]  CC:  Chief Complaint  Patient presents with  . Medical Management of Chronic Issues    HPI: Anna Hodges is a 73 y.o. female presenting on 06/19/2023 for Medical Management of Chronic Issues  Had a fall back in January and she states mostly better however her right thumb still has swelling and can't bend it completely without pain. She did have an xray of her left forearm but not the right side during that visit however it was able to bend back in [place] which was unremarkable.   Mood disorder, on prozac  20 mg and doing well. Her home situation is less stressful since her grand daughter moved out. She does not see a therapist however reports doing ok.    HLD: taking atorvastatin  40 mg nightly tolerating well.   She did see rheumatology dr rice and was advised to f/u as needed if there were some joint isolated concerns but to otherwise use prn topical steroid.  She states besides her thumb joint she is overall feeling ok.  Also denies any recent increase in her plaquing with psoriasis.        ROS: Negative unless specifically indicated above in HPI.   Relevant past medical history reviewed and updated as indicated.   Allergies and medications reviewed and updated.   Current Outpatient Medications:  .  aspirin  EC 81 MG tablet, Take 81 mg by mouth daily., Disp: , Rfl:  .  atorvastatin  (LIPITOR) 40 MG tablet, Take 1 tablet (40 mg total) by mouth daily., Disp: 90 tablet, Rfl: 3 .  cyclobenzaprine  (FLEXERIL ) 10 MG tablet, Take 1 tablet (10 mg total) by mouth daily as needed for muscle spasms., Disp: 30 tablet, Rfl: 0 .  FLUoxetine  (PROZAC ) 20 MG tablet, Take 1 tablet (20 mg total) by mouth daily. MUST HAVE OV FOR FURTHER REFILLS, Disp: 90 tablet, Rfl: 0 .  nabumetone  (RELAFEN ) 500 MG tablet, Take 1 tablet (500 mg total) by mouth 2  (two) times daily., Disp: 180 tablet, Rfl: 0 .  pantoprazole  (PROTONIX ) 40 MG tablet, TAKE ONE TABLET BY MOUTH EVERY MORNING, Disp: 90 tablet, Rfl: 3 .  promethazine (PHENERGAN) 6.25 MG/5ML syrup, Take by mouth every 6 (six) hours as needed for nausea or vomiting., Disp: , Rfl:  .  triamcinolone  cream (KENALOG ) 0.1 %, Apply 1 Application topically 2 (two) times daily., Disp: , Rfl:   Allergies  Allergen Reactions  . Codeine     REACTION: GI upset  . Duloxetine Other (See Comments)    Didn't seem to work   . Lamictal [Lamotrigine] Other (See Comments)    Didn't like how it made her feel  . Pregabalin Other (See Comments)    Didn't work     Objective:   BP 132/86 (BP Location: Left Arm, Patient Position: Sitting, Cuff Size: Normal)   Pulse 63   Temp 98.4 F (36.9 C) (Temporal)   Ht 5' 1.5" (1.562 m)   Wt 179 lb (81.2 kg)   SpO2 96%   BMI 33.27 kg/m    Physical Exam Constitutional:      General: She is not in acute distress.    Appearance: Normal appearance. She is normal weight. She is not ill-appearing, toxic-appearing or diaphoretic.  HENT:     Head: Normocephalic.  Cardiovascular:     Rate and Rhythm: Normal rate.  Pulmonary:  Effort: Pulmonary effort is normal.  Musculoskeletal:     Right hand: Bony tenderness present. No swelling. Decreased range of motion (right thumb).  Neurological:     General: No focal deficit present.     Mental Status: She is alert and oriented to person, place, and time. Mental status is at baseline.  Psychiatric:        Mood and Affect: Mood normal.        Behavior: Behavior normal.        Thought Content: Thought content normal.        Judgment: Judgment normal.    Assessment & Plan:  Injury of right thumb, initial encounter -     DG Hand Complete Right; Future     Follow up plan: No follow-ups on file.  Felicita Horns, FNP

## 2023-06-20 ENCOUNTER — Ambulatory Visit: Payer: Self-pay | Admitting: Family

## 2023-06-20 DIAGNOSIS — S6991XA Unspecified injury of right wrist, hand and finger(s), initial encounter: Secondary | ICD-10-CM | POA: Insufficient documentation

## 2023-06-20 NOTE — Assessment & Plan Note (Signed)
 Continue prozac  20 mg once daily  Improving

## 2023-06-20 NOTE — Assessment & Plan Note (Signed)
 Suspect arthritis however will obtain xray as this was initially a result of a fall injury  Ordering xray pending results.  Tylenol  arthritis, heat, compression gloves recommended.  Could also be flare of possible PSA so advised pt if this continues or worsens to schedule f/u with Dr. Rodell Citrin, rheumatology

## 2023-06-29 ENCOUNTER — Other Ambulatory Visit: Payer: Self-pay | Admitting: Family

## 2023-06-29 DIAGNOSIS — Z1231 Encounter for screening mammogram for malignant neoplasm of breast: Secondary | ICD-10-CM

## 2023-06-29 DIAGNOSIS — L4052 Psoriatic arthritis mutilans: Secondary | ICD-10-CM

## 2023-07-09 DIAGNOSIS — L821 Other seborrheic keratosis: Secondary | ICD-10-CM | POA: Diagnosis not present

## 2023-07-09 DIAGNOSIS — Z85828 Personal history of other malignant neoplasm of skin: Secondary | ICD-10-CM | POA: Diagnosis not present

## 2023-08-21 ENCOUNTER — Ambulatory Visit
Admission: RE | Admit: 2023-08-21 | Discharge: 2023-08-21 | Disposition: A | Discharge status: Home / Self Care | Source: Ambulatory Visit | Attending: Family

## 2023-08-21 DIAGNOSIS — Z1231 Encounter for screening mammogram for malignant neoplasm of breast: Secondary | ICD-10-CM

## 2023-08-24 ENCOUNTER — Ambulatory Visit: Payer: Self-pay | Admitting: Family

## 2023-09-13 ENCOUNTER — Ambulatory Visit: Payer: Medicare HMO

## 2023-09-13 VITALS — Ht 61.5 in | Wt 179.0 lb

## 2023-09-13 DIAGNOSIS — Z0000 Encounter for general adult medical examination without abnormal findings: Secondary | ICD-10-CM

## 2023-09-13 NOTE — Progress Notes (Signed)
 Subjective:   Anna Hodges is a 73 y.o. who presents for a Medicare Wellness preventive visit.  As a reminder, Annual Wellness Visits don't include a physical exam, and some assessments may be limited, especially if this visit is performed virtually. We may recommend an in-person follow-up visit with your provider if needed.  Visit Complete: Virtual I connected with  Anna Hodges on 09/13/23 by a audio enabled telemedicine application and verified that I am speaking with the correct person using two identifiers.  Patient Location: Home  Provider Location: Home Office  I discussed the limitations of evaluation and management by telemedicine. The patient expressed understanding and agreed to proceed.  Vital Signs: Because this visit was a virtual/telehealth visit, some criteria may be missing or patient reported. Any vitals not documented were not able to be obtained and vitals that have been documented are patient reported.  VideoDeclined- This patient declined Librarian, academic. Therefore the visit was completed with audio only.  Persons Participating in Visit: Patient.  AWV Questionnaire: Yes: Patient Medicare AWV questionnaire was completed by the patient on 09/09/23; I have confirmed that all information answered by patient is correct and no changes since this date.        Objective:    Today's Vitals   09/13/23 0935  Weight: 179 lb (81.2 kg)  Height: 5' 1.5 (1.562 m)   Body mass index is 33.27 kg/m.     09/13/2023    9:41 AM 09/06/2022    9:01 AM 01/10/2020   12:04 PM 01/22/2015    3:40 PM 01/13/2015    3:28 PM  Advanced Directives  Does Patient Have a Medical Advance Directive? Yes Yes No No  No   Type of Estate agent of [place];Living will Healthcare Power of [place];Living will     Copy of Healthcare Power of Attorney in Chart? No - copy requested No - copy requested     Would patient like  information on creating a medical advance directive?    No - patient declined information  No - patient declined information      Data saved with a previous flowsheet row definition    Current Medications (verified) Outpatient Encounter Medications as of 09/13/2023  Medication Sig   aspirin  EC 81 MG tablet Take 81 mg by mouth daily.   atorvastatin  (LIPITOR) 40 MG tablet Take 1 tablet (40 mg total) by mouth daily.   cyclobenzaprine  (FLEXERIL ) 10 MG tablet Take 1 tablet (10 mg total) by mouth daily as needed for muscle spasms.   FLUoxetine  (PROZAC ) 20 MG tablet Take 1 tablet (20 mg total) by mouth daily. MUST HAVE OV FOR FURTHER REFILLS   nabumetone  (RELAFEN ) 500 MG tablet TAKE ONE TABLET BY MOUTH TWICE A DAY   pantoprazole  (PROTONIX ) 40 MG tablet TAKE ONE TABLET BY MOUTH EVERY MORNING   promethazine (PHENERGAN) 6.25 MG/5ML syrup Take by mouth every 6 (six) hours as needed for nausea or vomiting.   triamcinolone  cream (KENALOG ) 0.1 % Apply 1 Application topically 2 (two) times daily.   No facility-administered encounter medications on file as of 09/13/2023.    Allergies (verified) Codeine, Duloxetine, Lamictal [lamotrigine], and Pregabalin   History: Past Medical History:  Diagnosis Date   Allergy    Anxiety    Arthritis    mainly in all her joints   GERD (gastroesophageal reflux disease)    PFO (patent foramen ovale)    01/05/10: transcranial doppler bubble study (Dr. Rosemarie): suggestive of  small right to left intracardiac shunt. He felt unlikely to be of clinical significance. Rx ASA.   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    Squamous cell carcinoma in situ (SCCIS) of skin of right lower leg    Past Surgical History:  Procedure Laterality Date   BACK SURGERY     1 lumbar   BREAST SURGERY     bilateral implants   CERVICAL SPINE SURGERY     COSMETIC SURGERY     EYE SURGERY  2033   JOINT  REPLACEMENT  01/22/2015   SPINE SURGERY     TOTAL SHOULDER ARTHROPLASTY Left 01/22/2015   Procedure: LEFT TOTAL SHOULDER ARTHROPLASTY;  Surgeon: Marcey Her, MD;  Location: [facility] OR;  Service: Orthopedics;  Laterality: Left;   VAGINAL HYSTERECTOMY     still with ovaries   Family History  Problem Relation Age of Onset   Hypertension Mother    Hyperlipidemia Mother    Hypertension Father    Hyperlipidemia Father    Bone cancer Sister    Bipolar disorder Son    Bipolar disorder Granddaughter    Bipolar disorder Granddaughter    Breast cancer Neg Hx    Social History   Socioeconomic History   Marital status: Divorced    Spouse name: Not on file   Number of children: Not on file   Years of education: Not on file   Highest education level: GED or equivalent  Occupational History   Occupation: retired  Tobacco Use   Smoking status: Former    Current packs/day: 0.00    Average packs/day: 0.3 packs/day for 6.0 years (1.5 ttl pk-yrs)    Types: Cigarettes    Start date: 01/17/1991    Quit date: 01/16/1997    Years since quitting: 26.6    Passive exposure: Current   Smokeless tobacco: Never  Vaping Use   Vaping status: Never Used  Substance and Sexual Activity   Alcohol use: No    Alcohol/week: 3.0 standard drinks of alcohol    Types: 3 Glasses of wine per week   Drug use: No   Sexual activity: Not Currently    Partners: Male    Birth control/protection: Surgical  Other Topics Concern   Not on file  Social History Narrative   Not on file   Social Drivers of Health   Financial Resource Strain: Low Risk  (09/09/2023)   Overall Financial Resource Strain (CARDIA)    Difficulty of Paying Living Expenses: Not very hard  Food Insecurity: No Food Insecurity (09/09/2023)   Hunger Vital Sign    Worried About Running Out of Food in the Last Year: Never true    Ran Out of Food in the Last Year: Never true  Transportation Needs: No Transportation Needs (09/09/2023)   PRAPARE -  Administrator, Civil Service (Medical): No    Lack of Transportation (Non-Medical): No  Physical Activity: Insufficiently Active (09/09/2023)   Exercise Vital Sign    Days of Exercise per Week: 4 days    Minutes of Exercise per Session: 30 min  Stress: No Stress Concern Present (09/09/2023)   [employer] of Occupational Health - Occupational Stress Questionnaire    Feeling of Stress: Only a little  Social Connections: Socially Isolated (09/09/2023)   Social Connection and Isolation Panel    Frequency of Communication with Friends and Family: More than three times a week  Frequency of Social Gatherings with Friends and Family: More than three times a week    Attends Religious Services: Patient declined    Database administrator or Organizations: No    Attends Engineer, structural: Not on file    Marital Status: Divorced    Tobacco Counseling Counseling given: Not Answered    Clinical Intake:  Pre-visit preparation completed: Yes  Pain : No/denies pain     BMI - recorded: 33.27 Nutritional Status: BMI > 30  Obese Nutritional Risks: None Diabetes: No  No results found for: HGBA1C   How often do you need to have someone help you when you read instructions, pamphlets, or other written materials from your doctor or pharmacy?: 1 - Never  Interpreter Needed?: No  Comments: lives with ex husband Information entered by :: B.Rembert Browe,LPN   Activities of Daily Living     09/09/2023    3:55 PM  In your present state of health, do you have any difficulty performing the following activities:  Hearing? 0  Vision? 0  Difficulty concentrating or making decisions? 0  Walking or climbing stairs? 0  Dressing or bathing? 0  Doing errands, shopping? 0  Preparing Food and eating ? N  Using the Toilet? N  In the past six months, have you accidently leaked urine? N  Do you have problems with loss of bowel control? N  Managing your Medications? N   Managing your Finances? N  Housekeeping or managing your Housekeeping? N    Patient Care Team: Corwin Antu, FNP as PCP - General (Family Medicine) Cleatus Collar, MD as Consulting Physician (Ophthalmology)  I have updated your Care Teams any recent Medical Services you may have received from other providers in the past year.     Assessment:   This is a routine wellness examination for G[place]gia.  Hearing/Vision screen Hearing Screening - Comments:: Patient denies any hearing difficulties.   Vision Screening - Comments:: Pt says their vision is good without glasses Dr  Cleatus   Goals Addressed             This Visit's Progress    Patient Stated       09/13/23-Continue to lose weight        Depression Screen     09/13/2023    9:40 AM 11/08/2022   11:16 AM 09/06/2022    8:59 AM 08/21/2022    8:20 AM 06/23/2022    8:57 AM 02/28/2022    4:23 PM  PHQ 2/9 Scores  PHQ - 2 Score 0 0 0 1 0 0  PHQ- 9 Score  3 0 2 2 2     Fall Risk     09/09/2023    3:55 PM 11/08/2022   11:16 AM 09/05/2022    3:26 PM 08/21/2022    8:21 AM 06/23/2022    8:56 AM  Fall Risk   Falls in the past year? 1 1 1 1 1   Number falls in past yr: 1 0 1 0 1  Injury with Fall? 0 0 1 1 1   Comment   right ankle and leg    Risk for fall due to : No Fall Risks History of fall(s) Impaired vision History of fall(s)   Follow up Education provided;Falls prevention discussed Falls evaluation completed Falls prevention discussed Falls evaluation completed Falls evaluation completed;Education provided;Falls prevention discussed    MEDICARE RISK AT HOME:  Medicare Risk at Home Any stairs in or around the home?: (Patient-Rptd) Yes If so, are  there any without handrails?: (Patient-Rptd) No Home free of loose throw rugs in walkways, pet beds, electrical cords, etc?: (Patient-Rptd) Yes Adequate lighting in your home to reduce risk of falls?: (Patient-Rptd) Yes Life alert?: (Patient-Rptd) No Use of a cane, walker or  w/c?: (Patient-Rptd) No Grab bars in the bathroom?: (Patient-Rptd) No Shower chair or bench in shower?: (Patient-Rptd) No Elevated toilet seat or a handicapped toilet?: (Patient-Rptd) No  TIMED UP AND GO:  Was the test performed?  No  Cognitive Function: 6CIT completed        09/06/2022    9:01 AM  6CIT Screen  What Year? 0 points  What month? 0 points  What time? 0 points  Count back from 20 0 points  Months in reverse 0 points  Repeat phrase 0 points  Total Score 0 points    Immunizations Immunization History  Administered Date(s) Administered   Fluad Trivalent(High Dose 65+) 11/08/2022   Hepatitis B, PED/ADOLESCENT 04/02/1998, 05/21/1998, 11/12/1998   Influenza Split 10/26/2009, 10/14/2012, 11/26/2013, 09/16/2017, 09/17/2018   Influenza Whole 10/16/2016   Influenza-Unspecified 01/29/2021, 01/29/2021   PFIZER(Purple Top)SARS-COV-2 Vaccination 02/21/2019, 03/18/2019, 11/19/2019   Pneumococcal Conjugate-13 12/24/2017   Pneumococcal Polysaccharide-23 04/28/2019   Tdap 11/26/2013   Zoster Recombinant(Shingrix) 04/07/2021, 07/27/2021   Zoster, Live 11/26/2013    Screening Tests Health Maintenance  Topic Date Due   COVID-19 Vaccine (4 - 2024-25 season) 09/17/2022   DEXA SCAN  03/29/2023   Colonoscopy  06/17/2023   INFLUENZA VACCINE  08/17/2023   DTaP/Tdap/Td (2 - Td or Tdap) 11/27/2023   Medicare Annual Wellness (AWV)  09/12/2024   MAMMOGRAM  08/20/2025   Pneumococcal Vaccine: 50+ Years  Completed   Hepatitis C Screening  Completed   Zoster Vaccines- Shingrix  Completed   HPV VACCINES  Aged Out   Meningococcal B Vaccine  Aged Out   Hepatitis B Vaccines 19-59 Average Risk  Discontinued    Health Maintenance  Health Maintenance Due  Topic Date Due   COVID-19 Vaccine (4 - 2024-25 season) 09/17/2022   DEXA SCAN  03/29/2023   Colonoscopy  06/17/2023   INFLUENZA VACCINE  08/17/2023   Health Maintenance Items Addressed: Colonoscopy scheduled for 09/28/23 Pt  unsure of Dexa Scan need (done 2 years ago) Will get vaccines at her local pharmacy when desired  Additional Screening:  Vision Screening: Recommended annual ophthalmology exams for early detection of glaucoma and other disorders of the eye. Would you like a referral to an eye doctor? No    Dental Screening: Recommended annual dental exams for proper oral hygiene  Community Resource Referral / Chronic Care Management: CRR required this visit?  No   CCM required this visit?  Appt scheduled with PCP   Plan:    I have personally reviewed and noted the following in the patient's chart:   Medical and social history Use of alcohol, tobacco or illicit drugs  Current medications and supplements including opioid prescriptions. Patient is not currently taking opioid prescriptions. Functional ability and status Nutritional status Physical activity Advanced directives List of other physicians Hospitalizations, surgeries, and ER visits in previous 12 months Vitals Screenings to include cognitive, depression, and falls Referrals and appointments  In addition, I have reviewed and discussed with patient certain preventive protocols, quality metrics, and best practice recommendations. A written personalized care plan for preventive services as well as general preventive health recommendations were provided to patient.   Erminio LITTIE Saris, LPN   [id number]   After Visit Summary: (MyChart) Due to this being  a telephonic visit, the after visit summary with patients personalized plan was offered to patient via MyChart   Notes: Nothing significant to report at this time.

## 2023-09-13 NOTE — Patient Instructions (Signed)
 Anna Hodges , Thank you for taking time out of your busy schedule to complete your Annual Wellness Visit with me. I enjoyed our conversation and look forward to speaking with you again next year. I, as well as your care team,  appreciate your ongoing commitment to your health goals. Please review the following plan we discussed and let me know if I can assist you in the future. Your Game plan/ To Do List    Referrals: If you haven't heard from the office you've been referred to, please reach out to them at the phone provided.   Follow up Visits: We will see or speak with you next year for your Next Medicare AWV with our clinical staff Have you seen your provider in the last 6 months (3 months if uncontrolled diabetes)? Yes  Clinician Recommendations:  Aim for 30 minutes of exercise or brisk walking, 6-8 glasses of water, and 5 servings of fruits and vegetables each day.       This is a list of the screenings recommended for you:  Health Maintenance  Topic Date Due   COVID-19 Vaccine (4 - 2024-25 season) 09/17/2022   DEXA scan (bone density measurement)  03/29/2023   Colon Cancer Screening  06/17/2023   Flu Shot  08/17/2023   DTaP/Tdap/Td vaccine (2 - Td or Tdap) 11/27/2023   Medicare Annual Wellness Visit  09/12/2024   Mammogram  08/20/2025   Pneumococcal Vaccine for age over 15  Completed   Hepatitis C Screening  Completed   Zoster (Shingles) Vaccine  Completed   HPV Vaccine  Aged Out   Meningitis B Vaccine  Aged Out   Hepatitis B Vaccine  Discontinued    Advanced directives: (Copy Requested) Please bring a copy of your health care power of attorney and living will to the office to be added to your chart at your convenience. You can mail to [facility] [address] [place], [place] [address] or email to [email or web address] Advance Care Planning is important because it:  [x]  Makes sure you receive the medical care that is consistent with your values, goals,  and preferences  [x]  It provides guidance to your family and loved ones and reduces their decisional burden about whether or not they are making the right decisions based on your wishes.  Follow the link provided in your after visit summary or read over the paperwork we have mailed to you to help you started getting your Advance Directives in place. If you need assistance in completing these, please reach out to us  so that we can help you!

## 2023-09-20 ENCOUNTER — Other Ambulatory Visit: Payer: Self-pay | Admitting: Family

## 2023-09-20 DIAGNOSIS — F419 Anxiety disorder, unspecified: Secondary | ICD-10-CM

## 2023-09-20 DIAGNOSIS — F39 Unspecified mood [affective] disorder: Secondary | ICD-10-CM

## 2023-09-28 DIAGNOSIS — Z860101 Personal history of adenomatous and serrated colon polyps: Secondary | ICD-10-CM | POA: Diagnosis not present

## 2023-09-28 DIAGNOSIS — K635 Polyp of colon: Secondary | ICD-10-CM | POA: Diagnosis not present

## 2023-09-28 DIAGNOSIS — Z09 Encounter for follow-up examination after completed treatment for conditions other than malignant neoplasm: Secondary | ICD-10-CM | POA: Diagnosis not present

## 2023-09-28 DIAGNOSIS — K5730 Diverticulosis of large intestine without perforation or abscess without bleeding: Secondary | ICD-10-CM | POA: Diagnosis not present

## 2023-09-28 DIAGNOSIS — K649 Unspecified hemorrhoids: Secondary | ICD-10-CM | POA: Diagnosis not present

## 2023-09-28 LAB — HM COLONOSCOPY

## 2023-10-19 ENCOUNTER — Other Ambulatory Visit: Payer: Self-pay | Admitting: Family

## 2023-11-01 ENCOUNTER — Other Ambulatory Visit: Payer: Self-pay | Admitting: Medical Genetics

## 2023-11-01 DIAGNOSIS — Z006 Encounter for examination for normal comparison and control in clinical research program: Secondary | ICD-10-CM

## 2023-12-07 ENCOUNTER — Other Ambulatory Visit: Payer: Self-pay | Admitting: Family

## 2024-01-07 ENCOUNTER — Other Ambulatory Visit: Payer: Self-pay | Admitting: Family

## 2024-01-07 DIAGNOSIS — F39 Unspecified mood [affective] disorder: Secondary | ICD-10-CM

## 2024-01-07 DIAGNOSIS — F419 Anxiety disorder, unspecified: Secondary | ICD-10-CM

## 2024-01-11 ENCOUNTER — Other Ambulatory Visit: Payer: Self-pay | Admitting: Family

## 2024-01-11 DIAGNOSIS — F39 Unspecified mood [affective] disorder: Secondary | ICD-10-CM

## 2024-01-11 DIAGNOSIS — F419 Anxiety disorder, unspecified: Secondary | ICD-10-CM

## 2024-01-11 NOTE — Telephone Encounter (Signed)
 Copied from CRM #[id number]. Topic: Clinical - Medication Refill >> Jan 11, 2024  3:19 PM Leah C wrote: Medication: FLUoxetine  (PROZAC ) 20 MG tablet  Has the patient contacted their pharmacy? Yes, they said to call clinic.  (Agent: If no, request that the patient contact the pharmacy for the refill. If patient does not wish to contact the pharmacy document the reason why and proceed with request.) (Agent: If yes, when and what did the pharmacy advise?)  This is the patient's preferred pharmacy:  [facility] [address] - [place], [place] - [address] [place] [address] Phone: [phone] Fax: [phone]  Is this the correct pharmacy for this prescription? Yes If no, delete pharmacy and type the correct one.   Has the prescription been filled recently? Yes  Is the patient out of the medication? Patient only has 3 left   Has the patient been seen for an appointment in the last year OR does the patient have an upcoming appointment? Yes  Can we respond through MyChart? Yes  Agent: Please be advised that Rx refills may take up to 3 business days. We ask that you follow-up with your pharmacy.

## 2024-01-13 MED ORDER — FLUOXETINE HCL 20 MG PO TABS: 20.0000 mg | ORAL_TABLET | Freq: Every day | ORAL | 0 refills | Status: AC

## 2024-01-29 ENCOUNTER — Ambulatory Visit: Admitting: Family

## 2024-01-29 ENCOUNTER — Encounter: Payer: Self-pay | Admitting: Family

## 2024-01-29 VITALS — BP 136/84 | HR 81 | Temp 98.4°F | Ht 61.5 in | Wt 194.8 lb

## 2024-01-29 DIAGNOSIS — M85852 Other specified disorders of bone density and structure, left thigh: Secondary | ICD-10-CM

## 2024-01-29 DIAGNOSIS — B070 Plantar wart: Secondary | ICD-10-CM | POA: Diagnosis not present

## 2024-01-29 DIAGNOSIS — Z1231 Encounter for screening mammogram for malignant neoplasm of breast: Secondary | ICD-10-CM | POA: Diagnosis not present

## 2024-01-29 DIAGNOSIS — Z0001 Encounter for general adult medical examination with abnormal findings: Secondary | ICD-10-CM | POA: Diagnosis not present

## 2024-01-29 DIAGNOSIS — E7800 Pure hypercholesterolemia, unspecified: Secondary | ICD-10-CM | POA: Diagnosis not present

## 2024-01-29 DIAGNOSIS — L4052 Psoriatic arthritis mutilans: Secondary | ICD-10-CM

## 2024-01-29 LAB — LIPID PANEL
Cholesterol: 215 mg/dL — ABNORMAL HIGH (ref 28–200)
HDL: 111.6 mg/dL
LDL Cholesterol: 86 mg/dL (ref 10–99)
NonHDL: 103.64
Total CHOL/HDL Ratio: 2
Triglycerides: 88 mg/dL (ref 10.0–149.0)
VLDL: 17.6 mg/dL (ref 0.0–40.0)

## 2024-01-29 LAB — BASIC METABOLIC PANEL WITH GFR
BUN: 13 mg/dL (ref 6–23)
CO2: 28 meq/L (ref 19–32)
Calcium: 9 mg/dL (ref 8.4–10.5)
Chloride: 104 meq/L (ref 96–112)
Creatinine, Ser: 0.52 mg/dL (ref 0.40–1.20)
GFR: 93.69 mL/min
Glucose, Bld: 95 mg/dL (ref 70–99)
Potassium: 3.8 meq/L (ref 3.5–5.1)
Sodium: 139 meq/L (ref 135–145)

## 2024-01-29 LAB — CBC
HCT: 40.9 % (ref 36.0–46.0)
Hemoglobin: 13.9 g/dL (ref 12.0–15.0)
MCHC: 34 g/dL (ref 30.0–36.0)
MCV: 87.2 fl (ref 78.0–100.0)
Platelets: 229 K/uL (ref 150.0–400.0)
RBC: 4.69 Mil/uL (ref 3.87–5.11)
RDW: 12.6 % (ref 11.5–15.5)
WBC: 7.4 K/uL (ref 4.0–10.5)

## 2024-01-29 NOTE — Patient Instructions (Signed)

## 2024-01-29 NOTE — Progress Notes (Signed)
 5  Subjective:  Patient ID: Anna Hodges, female    DOB: 02-May-1950  Age: 74 y.o. MRN: [id number]  Patient Care Team: Corwin Antu, FNP as PCP - General (Family Medicine) Cleatus Lamarr, MD as Consulting Physician (Ophthalmology)   CC:  Chief Complaint  Patient presents with   Annual Exam    HPI Michalina Calbert Shatto is a 74 y.o. female who presents today for an annual physical exam. She reports consuming a general diet. The patient does not participate in regular exercise at present. She generally feels well. She reports sleeping well. She does have additional problems to discuss today.   Vision:Within last year Dental:Receives regular dental care  Mammogram: 08/21/23 Last pap: > 65 y/o  Colonoscopy: 10/08/23 repeat in five years  Bone density scan: pt will get in August, last done 03/2021  Pt is with acute concerns.   Discussed the use of AI scribe software for clinical note transcription with the patient, who gave verbal consent to proceed.  History of Present Illness Anna Hodges is a 74 year old female who presents with right foot pain.  She has been experiencing sharp pain in her right foot for about a month, specifically at the base of the toes on the pad underneath. The pain occurs when weight is placed on the foot, causing her to limp. There is no known injury to the foot, and the pain can occur suddenly while walking. The pain is present across the pad under all the toes, not just under the big toe. She can move her foot without pain, and there is no pain in the rest of the leg.    Advanced Directives Patient does have advanced directives . She does not have a copy in the electronic medical record.   DEPRESSION SCREENING    01/29/2024    8:32 AM 09/13/2023    9:40 AM 11/08/2022   11:16 AM 09/06/2022    8:59 AM 08/21/2022    8:20 AM 06/23/2022    8:57 AM 02/28/2022    4:23 PM  PHQ 2/9 Scores  PHQ - 2 Score 0 0 0 0 1 0 0  PHQ- 9 Score 0  3   0  2  2  2       Data saved with a previous flowsheet row definition     ROS: Negative unless specifically indicated above in HPI.   Current Medications[1]    Objective:    BP 136/84 (BP Location: Left Arm, Patient Position: Sitting, Cuff Size: Large)   Pulse 81   Temp 98.4 F (36.9 C) (Temporal)   Ht 5' 1.5 (1.562 m)   Wt 194 lb 12.8 oz (88.4 kg)   SpO2 95%   BMI 36.21 kg/m   BP Readings from Last 3 Encounters:  01/29/24 136/84  06/19/23 132/86  01/22/23 132/84      Physical Exam Constitutional:      General: She is not in acute distress.    Appearance: Normal appearance. She is normal weight. She is not ill-appearing.  HENT:     Head: Normocephalic.     Right Ear: Tympanic membrane normal.     Left Ear: Tympanic membrane normal.     Nose: Nose normal.     Mouth/Throat:     Mouth: Mucous membranes are moist.  Eyes:     Extraocular Movements: Extraocular movements intact.     Pupils: Pupils are equal, round, and reactive to light.  Cardiovascular:     Rate and  Rhythm: Normal rate and regular rhythm.  Pulmonary:     Effort: Pulmonary effort is normal.     Breath sounds: Normal breath sounds.  Abdominal:     General: Abdomen is flat. Bowel sounds are normal.     Palpations: Abdomen is soft.     Tenderness: There is no guarding or rebound.  Musculoskeletal:        General: Normal range of motion.     Cervical back: Normal range of motion.  Feet:     Left foot:     Skin integrity: Callus (plantar wart tender) present.  Skin:    General: Skin is warm.     Capillary Refill: Capillary refill takes less than 2 seconds.  Neurological:     General: No focal deficit present.     Mental Status: She is alert.  Psychiatric:        Mood and Affect: Mood normal.        Behavior: Behavior normal.        Thought Content: Thought content normal.        Judgment: Judgment normal.       Results Diagnostic Bone density (2023): Osteopenia of the left  hip Rheumatology assessment for psoriatic arthritis (2024): No definite synovitis or enthesitis; cutaneous disease well controlled  Cryotherapy of plantar wart Liquid nitrogen applied to plantar wart on right foot in three cycles of ten seconds each. Lesion blanched with treatment.      Assessment & Plan:   Assessment and Plan Assessment & Plan Plantar wart Right foot causing significant pain, especially when weight-bearing. The wart is located at the base of the toes, under the pad of the foot. The pain is sharp and occurs suddenly, affecting her ability to walk comfortably. - Performed cryotherapy on the plantar wart using freezing spray. - Advised that repeated cryotherapy may be necessary, potentially 3-5 times. - Recommended purchasing plantar wart pads for cushioning during healing. Pt gave verbal consent prior. Scalpel used to par wart prior to treatment with cryotherapy. Cryotherapy blast applied until white bead formation x 2 rounds. Advised pt to apply Aquaphor otherwise no other treatment needed. Educated will likely start to resolve within 4-6 days, may blister and or slough off however monitor for s/s infection. Pt tolerated procedure well  May need repeat rounds of cryotherapy pt knows to schedule f/u if repeat session needed.  Handout given to pt as well.    Osteopenia of left hip Osteopenia localized to the left hip, indicating decreased bone density. Discussed the importance of calcium  and vitamin D supplementation, as well as light weight-bearing exercises to strengthen bones and prevent progression to osteoporosis. Explained that osteopenia is a precursor to osteoporosis and emphasized the role of lifestyle modifications in management. - Recommended calcium  and vitamin D supplementation. - Advised light weight-bearing exercises to strengthen bones.  Psoriatic arthritis mutilans Psoriatic arthritis is well-controlled with no definite synovitis or effusion. Skin is  well-controlled on the current regimen. - Continue current regimen for psoriatic arthritis.  Pure hypercholesterolemia Due for routine cholesterol labs as part of general health maintenance. - Ordered routine cholesterol labs.  General Health Maintenance Routine health maintenance discussed, including mammogram and bone density screening. Mammogram due in August, and bone density screening should be scheduled after August. Discussed the importance of flu vaccination. - Ordered mammogram and bone density screening after August. -Patient Counseling(The following topics were reviewed):  Preventative care handout given to pt  Health maintenance and immunizations reviewed. Please refer to Health  maintenance section. Pt advised on safe sex, wearing seatbelts in car, and proper nutrition labwork ordered today for annual Dental health: Discussed importance of regular tooth brushing, flossing, and dental visits.           Follow-up: Return in about 6 months (around 07/28/2024) for f/u cholesterol.   Ginger Patrick, FNP     [1]  Current Outpatient Medications:    aspirin  EC 81 MG tablet, Take 81 mg by mouth daily., Disp: , Rfl:    atorvastatin  (LIPITOR) 40 MG tablet, TAKE ONE TABLET BY MOUTH ONE TIME DAILY, Disp: 90 tablet, Rfl: 1   cyclobenzaprine  (FLEXERIL ) 10 MG tablet, Take 1 tablet (10 mg total) by mouth daily as needed for muscle spasms., Disp: 30 tablet, Rfl: 0   FLUoxetine  (PROZAC ) 20 MG tablet, Take 1 tablet (20 mg total) by mouth daily., Disp: 90 tablet, Rfl: 0   nabumetone  (RELAFEN ) 500 MG tablet, TAKE ONE TABLET BY MOUTH TWICE A DAY, Disp: 180 tablet, Rfl: 0   pantoprazole  (PROTONIX ) 40 MG tablet, TAKE ONE TABLET BY MOUTH EVERY MORNING, Disp: 90 tablet, Rfl: 0   triamcinolone  cream (KENALOG ) 0.1 %, Apply 1 Application topically 2 (two) times daily., Disp: , Rfl:

## 2024-01-31 ENCOUNTER — Ambulatory Visit: Payer: Self-pay | Admitting: Family

## 2024-09-16 ENCOUNTER — Ambulatory Visit
